# Patient Record
Sex: Female | Born: 1985 | Hispanic: No | Marital: Married | Smoking: Never smoker
Health system: Southern US, Community
[De-identification: ages and names within clinical notes are randomized; demographics above are authoritative.]

## PROBLEM LIST (undated history)

## (undated) DIAGNOSIS — N200 Calculus of kidney: Secondary | ICD-10-CM

## (undated) DIAGNOSIS — N83209 Unspecified ovarian cyst, unspecified side: Secondary | ICD-10-CM

## (undated) HISTORY — DX: Calculus of kidney: N20.0

## (undated) HISTORY — DX: Unspecified ovarian cyst, unspecified side: N83.209

## (undated) HISTORY — PX: TONSILLECTOMY: SUR1361

## (undated) HISTORY — PX: WISDOM TOOTH EXTRACTION: SHX21

---

## 2017-01-13 ENCOUNTER — Encounter (HOSPITAL_COMMUNITY): Payer: Self-pay

## 2017-01-13 ENCOUNTER — Inpatient Hospital Stay (HOSPITAL_COMMUNITY)
Admission: AD | Admit: 2017-01-13 | Discharge: 2017-01-13 | Disposition: A | Discharge status: Home / Self Care | Payer: BLUE CROSS/BLUE SHIELD | Source: Ambulatory Visit | Attending: Obstetrics & Gynecology | Admitting: Obstetrics & Gynecology

## 2017-01-13 DIAGNOSIS — N946 Dysmenorrhea, unspecified: Secondary | ICD-10-CM | POA: Diagnosis present

## 2017-01-13 LAB — URINALYSIS, ROUTINE W REFLEX MICROSCOPIC
Bilirubin Urine: NEGATIVE
GLUCOSE, UA: NEGATIVE mg/dL
HGB URINE DIPSTICK: NEGATIVE
KETONES UR: NEGATIVE mg/dL
Leukocytes, UA: NEGATIVE
Nitrite: NEGATIVE
PH: 5 (ref 5.0–8.0)
PROTEIN: NEGATIVE mg/dL
Specific Gravity, Urine: 1.014 (ref 1.005–1.030)

## 2017-01-13 LAB — POCT PREGNANCY, URINE: Preg Test, Ur: NEGATIVE

## 2017-01-13 MED ORDER — IBUPROFEN 800 MG PO TABS
400.0000 mg | ORAL_TABLET | Freq: Once | ORAL | Status: DC
  Filled 2017-01-13: qty 1

## 2017-01-13 MED ORDER — IBUPROFEN 800 MG PO TABS
800.0000 mg | ORAL_TABLET | Freq: Once | ORAL | Status: AC
  Administered 2017-01-13: 800 mg via ORAL

## 2017-01-13 NOTE — MAU Provider Note (Signed)
History   pt in c/o abd pain, cramping and period being 4 days late. Condoms as contraceptive, had on to break this cycle but used plan B. States has not taken anything for the discomfort.   CSN: [id number]  Arrival date & time 01/13/17  1811   First Provider Initiated Contact with Patient 01/13/17 1840      Chief Complaint  Patient presents with  . Abdominal Pain  . Late Period    HPI  No past medical history on file.  Past Surgical History:  Procedure Laterality Date  . CESAREAN SECTION     2014, 2017    Family History  Problem Relation Age of Onset  . Drug abuse Paternal Grandmother     Social History  Substance Use Topics  . Smoking status: Never Smoker  . Smokeless tobacco: Never Used  . Alcohol use No    OB History    No data available      Review of Systems  Constitutional: Negative.   HENT: Negative.   Eyes: Negative.   Respiratory: Negative.   Cardiovascular: Negative.   Gastrointestinal: Positive for abdominal pain.  Endocrine: Negative.   Genitourinary: Positive for vaginal bleeding.  Musculoskeletal: Negative.   Skin: Negative.   Allergic/Immunologic: Negative.   Neurological: Negative.   Hematological: Negative.   Psychiatric/Behavioral: Negative.     Allergies  Patient has no known allergies.  Home Medications    There were no vitals taken for this visit.  Physical Exam  Constitutional: She is oriented to person, place, and time. She appears well-developed and well-nourished.  HENT:  Head: Normocephalic.  Eyes: Pupils are equal, round, and reactive to light.  Neck: Normal range of motion.  Cardiovascular: Normal rate, regular rhythm, normal heart sounds and intact distal pulses.   Pulmonary/Chest: Effort normal and breath sounds normal.  Abdominal: Soft. Bowel sounds are normal.  Genitourinary: Vagina normal and uterus normal.  Musculoskeletal: Normal range of motion.  Neurological: She is alert and oriented to person, place,  and time. She has normal reflexes.  Skin: Skin is warm and dry.  Psychiatric: She has a normal mood and affect. Her behavior is normal. Judgment and thought content normal.    MAU Course  Procedures (including critical care time)  Labs Reviewed  URINALYSIS, ROUTINE W REFLEX MICROSCOPIC  POCT PREGNANCY, URINE   No results found.   No diagnosis found.  Dysmenorrhea   MDM  VSS, bleeding sm amt with exam. Explained to pt that Plan B can alter her cycle. And also it may take up to 3 mos for her period to regulate. Exam WNL will d/c home.

## 2017-01-13 NOTE — Discharge Instructions (Signed)
Dysmenorrhea Dysmenorrhea means painful cramps during your period (menstrual period). You will have pain in your lower belly (abdomen). The pain is caused by the tightening (contracting) of the muscles of the womb (uterus). The pain may be mild or very bad. With this condition, you may:  Have a headache.  Feel sick to your stomach (nauseous).  Throw up (vomit).  Have lower back pain. Follow these instructions at home: Helping pain and cramping   Put heat on your lower back or belly when you have pain or cramps. Use the heat source that your doctor tells you to use.  Place a towel between your skin and the heat.  Leave the heat on for 20-30 minutes.  Remove the heat if your skin turns bright red. This is especially important if you cannot feel pain, heat, or cold.  Do not have a heating pad on during sleep.  Do aerobic exercises. These include walking, swimming, or biking. These may help with cramps.  Massage your lower back or belly. This may help lessen pain. General instructions   Take over-the-counter and prescription medicines only as told by your doctor.  Do not drive or use heavy machinery while taking prescription pain medicine.  Avoid alcohol and caffeine during and right before your period. These can make cramps worse.  Do not use any products that have nicotine or tobacco. These include cigarettes and e-cigarettes. If you need help quitting, ask your doctor.  Keep all follow-up visits as told by your doctor. This is important. Contact a doctor if:  You have pain that gets worse.  You have pain that does not get better with medicine.  You have pain during sex.  You feel sick to your stomach or you throw up during your period, and medicine does not help. Get help right away if:  You pass out (faint). Summary  Dysmenorrhea means painful cramps during your period (menstrual period).  Put heat on your lower back or belly when you have pain or cramps.  Do  exercises like walking, swimming, or biking to help with cramps.  Contact a doctor if you have pain during sex. This information is not intended to replace advice given to you by your health care provider. Make sure you discuss any questions you have with your health care provider. Document Released: 11/24/2008 Document Revised: 09/14/2016 Document Reviewed: 09/14/2016 Elsevier Interactive Patient Education  2017 Elsevier Inc.  

## 2017-01-13 NOTE — Progress Notes (Addendum)
G3P2 late period. Presents to triage for late period and abdominal pain.   Provider at bs assessing pt. Orders received to give motrin 800mg  PO.   Drink and cracker given  1849: Motrin administered. Pt tolertating crackers and drink  1858: provider at bs.   Discharge instructions given with pt understanding. Pt left unit via ambulatory.

## 2019-04-30 ENCOUNTER — Other Ambulatory Visit: Payer: Self-pay

## 2019-04-30 DIAGNOSIS — Z20822 Contact with and (suspected) exposure to covid-19: Secondary | ICD-10-CM

## 2019-05-01 LAB — NOVEL CORONAVIRUS, NAA: SARS-CoV-2, NAA: NOT DETECTED

## 2019-06-25 ENCOUNTER — Telehealth: Payer: Self-pay

## 2019-06-25 NOTE — Telephone Encounter (Signed)
Copied from [place] [id number]. Topic: Appointment Scheduling - New Patient >> Jun 24, 2019  5:07 PM Rutherford Nail, [place] wrote: New patient has been scheduled for your office. Provider: Dr Aundra Dubin Date of Appointment: 09/04/2019 at 8:30am  Route to department's PEC pool.

## 2019-09-04 ENCOUNTER — Encounter: Payer: Self-pay | Admitting: Internal Medicine

## 2019-09-04 ENCOUNTER — Ambulatory Visit (INDEPENDENT_AMBULATORY_CARE_PROVIDER_SITE_OTHER): Payer: Managed Care, Other (non HMO) | Admitting: Internal Medicine

## 2019-09-04 ENCOUNTER — Other Ambulatory Visit: Payer: Self-pay

## 2019-09-04 VITALS — Ht 62.0 in | Wt 180.0 lb

## 2019-09-04 DIAGNOSIS — E611 Iron deficiency: Secondary | ICD-10-CM

## 2019-09-04 DIAGNOSIS — N920 Excessive and frequent menstruation with regular cycle: Secondary | ICD-10-CM | POA: Insufficient documentation

## 2019-09-04 DIAGNOSIS — M542 Cervicalgia: Secondary | ICD-10-CM

## 2019-09-04 DIAGNOSIS — M25511 Pain in right shoulder: Secondary | ICD-10-CM

## 2019-09-04 DIAGNOSIS — E538 Deficiency of other specified B group vitamins: Secondary | ICD-10-CM

## 2019-09-04 DIAGNOSIS — L80 Vitiligo: Secondary | ICD-10-CM | POA: Diagnosis not present

## 2019-09-04 DIAGNOSIS — L659 Nonscarring hair loss, unspecified: Secondary | ICD-10-CM | POA: Insufficient documentation

## 2019-09-04 DIAGNOSIS — R5383 Other fatigue: Secondary | ICD-10-CM | POA: Diagnosis not present

## 2019-09-04 DIAGNOSIS — N83209 Unspecified ovarian cyst, unspecified side: Secondary | ICD-10-CM

## 2019-09-04 DIAGNOSIS — Z1389 Encounter for screening for other disorder: Secondary | ICD-10-CM

## 2019-09-04 DIAGNOSIS — Z13220 Encounter for screening for lipoid disorders: Secondary | ICD-10-CM

## 2019-09-04 DIAGNOSIS — E559 Vitamin D deficiency, unspecified: Secondary | ICD-10-CM

## 2019-09-04 DIAGNOSIS — L305 Pityriasis alba: Secondary | ICD-10-CM

## 2019-09-04 DIAGNOSIS — E0789 Other specified disorders of thyroid: Secondary | ICD-10-CM

## 2019-09-04 MED ORDER — DESONIDE 0.05 % EX CREA: TOPICAL_CREAM | Freq: Two times a day (BID) | CUTANEOUS | 0 refills | Status: AC

## 2019-09-04 NOTE — Progress Notes (Signed)
Virtual Visit via Video Note  I connected with Kristin Freeman  on 09/04/19 at  8:37 AM EST by a video enabled telemedicine application and verified that I am speaking with the correct person using two identifiers.  Location patient: home Location provider:work or home office Persons participating in the virtual visit: patient, provider  I discussed the limitations of evaluation and management by telemedicine and the availability of in person appointments. The patient expressed understanding and agreed to proceed.   HPI:  New patient with multiple complaints  1.fatigue new  2. FH thyroid d/o and wants thyroid checked  3. C/o white patches near right eye and right side of mouth getting more prominent over months and noticeable to her mom  4. C/o hair loss dad with female pattern baldness otherwise no hair loss in family and grey hairs since age 54 y.o  5. C/o heavy cycles lasting 2-3 days which is different pattern and h/o ovarian cyst and also c/o weight gain 30-40 lbs in 6-7 months  6. C/o weight gain no change in diet not really exercising  7. Right neck in area of thyroid ttp and bulging but goes away at times for weeks and right shoulder pain and ttp x 1-2 weeks now slightly improved    ROS: See pertinent positives and negatives per HPI. General: wt gain 30-40 lbs x 6-7 months  HEENT: normal hearing +neck swelling  CV: no chest pain  Lungs: no sob  GI: no ab pain  GU: +change in cycle  MSK: right shoulder pain  Skin: hair loss and white patches on skin  Neuro: no h/a  Psych: mood normal   Past Medical History:  Diagnosis Date  . Kidney stones   . Ovarian cyst     Past Surgical History:  Procedure Laterality Date  . CESAREAN SECTION     2014, 2017  . TONSILLECTOMY    . WISDOM TOOTH EXTRACTION      Family History  Problem Relation Age of Onset  . Diabetes Paternal Grandmother   . Breast cancer Mother        dx age 68 stage 92  . Anxiety disorder Mother   . Diabetes  Maternal Grandmother   . Diabetes Maternal Grandfather   . Diabetes Paternal Grandfather   . Thyroid disease Other        cousin   . Thyroid disease Maternal Aunt     SOCIAL HX:  2 kids married  Works from home  [place] husband Ramour Azhar [place] from [place] 1 year ago in 2019    Current Outpatient Medications:  .  desonide (DESOWEN) 0.05 % cream, Apply topically 2 (two) times daily. Prn face, Disp: 30 g, Rfl: 0  EXAM:  VITALS per patient if applicable:  GENERAL: alert, oriented, appears well and in no acute distress  HEENT: atraumatic, conjunttiva clear, no obvious abnormalities on inspection of external nose and ears  NECK: normal movements of the head and neck  LUNGS: on inspection no signs of respiratory distress, breathing rate appears normal, no obvious gross SOB, gasping or wheezing  CV: no obvious cyanosis  MS: moves all visible extremities without noticeable abnormality  PSYCH/NEURO: pleasant and cooperative, no obvious depression or anxiety, speech and thought processing grossly intact  ASSESSMENT AND PLAN:  Discussed the following assessment and plan:  Fatigue, unspecified type - Plan: Comprehensive metabolic panel, CBC with Differential/Platelet, TSH, T4, free, T3, free  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 Thyroid vs MSK Consider thyroid US in future   Hair loss - Plan: Ambulatory referral to Dermatology  Menorrhagia with regular cycle - Plan: obgyn Cyst of ovary, unspecified laterality - Plan: obgyn  HM ? If had flu shot in 2019/2020 Tdap had in 2012 HPV vaccine not had   Pap had in [place] in 2019  Refer dermatology c/w vitiligo/eczema p. Alba to right side of face with white spots and hair loss  mammo age 33 y.o   -we discussed possible serious and likely etiologies,  options for evaluation and workup, limitations of telemedicine visit vs in person visit, treatment, treatment risks and precautions. Pt prefers to treat via telemedicine empirically rather then risking or undertaking an in person visit at this moment. Patient agrees to seek prompt in person care if worsening, new symptoms arise, or if is not improving with treatment.   I discussed the assessment and treatment plan with the patient. The patient was provided an opportunity to ask questions and all were answered. The patient agreed with the plan and demonstrated an understanding of the instructions.   The patient was advised to call back or seek an in-person evaluation if the symptoms worsen or if the condition fails to improve as anticipated.  Time spent 30 minutes Bevelyn Buckles, MD

## 2019-09-04 NOTE — Patient Instructions (Signed)

## 2021-09-22 ENCOUNTER — Ambulatory Visit: Payer: Managed Care, Other (non HMO) | Admitting: Dermatology

## 2021-10-11 ENCOUNTER — Ambulatory Visit: Payer: Managed Care, Other (non HMO) | Admitting: Dermatology

## 2021-10-11 ENCOUNTER — Other Ambulatory Visit: Payer: Self-pay

## 2021-10-11 DIAGNOSIS — L700 Acne vulgaris: Secondary | ICD-10-CM

## 2021-10-11 DIAGNOSIS — L988 Other specified disorders of the skin and subcutaneous tissue: Secondary | ICD-10-CM

## 2021-10-11 DIAGNOSIS — B001 Herpesviral vesicular dermatitis: Secondary | ICD-10-CM

## 2021-10-11 MED ORDER — TRETINOIN 0.025 % EX CREA: TOPICAL_CREAM | Freq: Every day | CUTANEOUS | 11 refills | Status: AC

## 2021-10-11 MED ORDER — VALACYCLOVIR HCL 500 MG PO TABS: 500.0000 mg | ORAL_TABLET | Freq: Every day | ORAL | 11 refills | Status: DC

## 2021-10-11 MED ORDER — AMZEEQ 4 % EX FOAM: 1.0000 "application " | Freq: Every day | CUTANEOUS | 6 refills | Status: AC

## 2021-10-11 NOTE — Progress Notes (Signed)
° °  New Patient Visit  Subjective  Kristin Freeman is a 36 y.o. female who presents for the following: Facial Elastosis (Pt would to discuss options for tightening at that jaw line and wrinkles at the forehead) and Acne (Pt states that she occasionally gets minor hormonal acne and also would like to discuss acne scars from previous years of cystic acne. ).  Objective  Well appearing patient in no apparent distress; mood and affect are within normal limits.  A focused examination was performed including face. Relevant physical exam findings are noted in the Assessment and Plan.  face Rhytides and volume loss.   Head - Anterior (Face) Trace open comedones with rare inflammatory papule at face. Many older scars.  Lips Erythematous papule with vesicles.   Assessment & Plan  Elastosis of skin face  Would do 2 syringes of voluma at jawline if we were to use filler for the lift.  Would consider a consult with Dr. Sedalia Muta at [facility] in [place] to consider other options.     Acne vulgaris Head - Anterior (Face)  Start Tretinoin 0.025% cream. Apply pea sized amount to face starting every other night, increase to qd as tolerated. Follow with a moisturizer.   Start amzeeq foam. Apply thin layer to affected areas as needed for spot treatment. Sent to [place].   Has done Fraxel laser in past for acne scarring with improvement. Recommend Fraxel or Halo laser or microneedling.  Recommend [facility] in [facility] or [facility] in [place].   tretinoin (RETIN-A) 0.025 % cream - Head - Anterior (Face) Apply topically at bedtime.  Minocycline HCl Micronized (AMZEEQ) 4 % FOAM - Head - Anterior (Face) Apply 1 application topically daily. PRN spot treatment.  Cold sore Lips  Start Valacyclovir 500 mg qd for prevention. Take with large glass of water.   Take 4 tabs (2000 mg total) and repeat in 12 hours for flares.   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  It is infectious and can be spread from person to person by direct contact.  It is not curable, but is treatable with topical and oral medication.  Chronic and persistent condition with duration or expected duration over one year. Condition is bothersome/symptomatic for patient. Currently flared.   valACYclovir (VALTREX) 500 MG tablet - Lips Take 1 tablet (500 mg total) by mouth daily.   Return in about 1 year (around 10/11/2022) for acne.  I, Epifania Gore, CMA, am acting as scribe for Darden Dates, MD.  Documentation: I have reviewed the above documentation for accuracy and completeness, and I agree with the above.  Darden Dates, MD

## 2021-10-11 NOTE — Patient Instructions (Addendum)
Recommend Fraxel or Halo laser or microneedling.  Recommend Dr. Sedalia Muta [facility] in [place] or [facility] in [place].   For Cold Sore Flare: Take 4 tabs (2000 mg total) and repeat in 12 hours.    If You Need Anything After Your Visit  If you have any questions or concerns for your doctor, please call our main line at [phone] and press option 4 to reach your doctor's medical assistant. If no one answers, please leave a voicemail as directed and we will return your call as soon as possible. Messages left after 4 pm will be answered the following business day.   You may also send [place] a message via MyChart. We typically respond to MyChart messages within 1-2 business days.  For prescription refills, please ask your pharmacy to contact our office. Our fax number is [phone].  If you have an urgent issue when the clinic is closed that cannot wait until the next business day, you can page your doctor at the number below.    Please note that while we do our best to be available for urgent issues outside of office hours, we are not available 24/7.   If you have an urgent issue and are unable to reach [place], you may choose to seek medical care at your doctor's office, retail clinic, urgent care center, or emergency room.  If you have a medical emergency, please immediately call 911 or go to the emergency department.  Pager Numbers  - Dr. Gwen Pounds: [phone]  - Dr. Neale Burly: [phone]  - Dr. Roseanne Reno: [phone]  In the event of inclement weather, please call our main line at [phone] for an update on the status of any delays or closures.  Dermatology Medication Tips: Please keep the boxes that topical medications come in in order to help keep track of the instructions about where and how to use these. Pharmacies typically print the medication instructions only on the boxes and not directly on the medication tubes.   If your medication is too  expensive, please contact our office at [phone] option 4 or send [place] a message through MyChart.   We are unable to tell what your co-pay for medications will be in advance as this is different depending on your insurance coverage. However, we may be able to find a substitute medication at lower cost or fill out paperwork to get insurance to cover a needed medication.   If a prior authorization is required to get your medication covered by your insurance company, please allow [place] 1-2 business days to complete this process.  Drug prices often vary depending on where the prescription is filled and some pharmacies may offer cheaper prices.  The website www.goodrx.com contains coupons for medications through different pharmacies. The prices here do not account for what the cost may be with help from insurance (it may be cheaper with your insurance), but the website can give you the price if you did not use any insurance.  - You can print the associated coupon and take it with your prescription to the pharmacy.  - You may also stop by our office during regular business hours and pick up a GoodRx coupon card.  - If you need your prescription sent electronically to a different pharmacy, notify our office through [facility] or by phone at [phone] option 4.     Si Usted Necesita Algo Despus de Su Visita  Tambin puede enviarnos un mensaje a travs de Clinical cytogeneticist. Por lo  general respondemos a los mensajes de MyChart en el transcurso de 1 a 2 das hbiles.  Para renovar recetas, por favor pida a su farmacia que se ponga en contacto con nuestra oficina. [place] de fax es [place] [phone].  Si tiene un asunto urgente cuando la clnica est cerrada y que no puede esperar hasta el siguiente da hbil, puede llamar/localizar a su doctor(a) al nmero que aparece a continuacin.   Por favor, tenga en cuenta que aunque hacemos todo lo posible para estar disponibles para asuntos urgentes fuera  del horario de [place], no estamos disponibles las 24 horas del da, los 7 das de la [place].   Si tiene un problema urgente y no puede comunicarse con nosotros, puede optar por buscar atencin mdica  en el consultorio de su doctor(a), en una clnica privada, en un centro de atencin urgente o en una sala de emergencias.  Si tiene Engineering geologist, por favor llame inmediatamente al 911 o vaya a la sala de emergencias.  Nmeros de bper  - Dr. Nehemiah Massed: [phone]  - Dra. Moye: [phone]  - Dra. Nicole Kindred: [phone]  En caso de inclemencias del [place], por favor llame a Johnsie Kindred principal al [phone] para una actualizacin sobre el [place] de cualquier retraso o cierre.  Consejos para la medicacin en dermatologa: Por favor, guarde las cajas en las que vienen los medicamentos de uso tpico para ayudarle a seguir las instrucciones sobre dnde y cmo usarlos. Las farmacias generalmente imprimen las instrucciones del medicamento slo en las cajas y no directamente en los tubos del [place].   Si su medicamento es muy caro, por favor, pngase en contacto con Zigmund Daniel llamando al [phone] y presione la opcin 4 o envenos un mensaje a travs de Pharmacist, community.   No podemos decirle cul ser su copago por los medicamentos por adelantado ya que esto es diferente dependiendo de la cobertura de su seguro. Sin embargo, es posible que podamos encontrar un medicamento sustituto a Electrical engineer un formulario para que el seguro cubra el medicamento que se considera necesario.   Si se requiere una autorizacin previa para que su compaa de seguros [place] su medicamento, por favor permtanos de 1 a 2 das hbiles para completar este proceso.  Los precios de los medicamentos varan con frecuencia dependiendo del Environmental consultant de dnde se surte la receta y alguna farmacias pueden ofrecer precios ms baratos.  El sitio web www.goodrx.com tiene cupones para medicamentos de Office manager. Los precios aqu no tienen en cuenta lo que podra costar con la ayuda del seguro (puede ser ms barato con su seguro), pero el sitio web puede darle el precio si no utiliz Research scientist (physical sciences).  - Puede imprimir el cupn correspondiente y llevarlo con su receta a la farmacia.  - Tambin puede pasar por nuestra oficina durante el horario de atencin regular y Charity fundraiser una tarjeta de cupones de GoodRx.  - Si necesita que su receta se enve electrnicamente a una farmacia diferente, informe a nuestra oficina a travs de MyChart de [place] o por telfono llamando al [phone] y presione la opcin 4.

## 2021-10-19 ENCOUNTER — Encounter: Payer: Self-pay | Admitting: Dermatology

## 2022-02-16 ENCOUNTER — Emergency Department
Admission: EM | Admit: 2022-02-16 | Discharge: 2022-02-17 | Disposition: A | Discharge status: Home / Self Care | Payer: Managed Care, Other (non HMO) | Attending: Emergency Medicine | Admitting: Emergency Medicine

## 2022-02-16 ENCOUNTER — Other Ambulatory Visit: Payer: Self-pay

## 2022-02-16 ENCOUNTER — Emergency Department: Payer: Managed Care, Other (non HMO)

## 2022-02-16 DIAGNOSIS — M6281 Muscle weakness (generalized): Secondary | ICD-10-CM | POA: Insufficient documentation

## 2022-02-16 DIAGNOSIS — R4789 Other speech disturbances: Secondary | ICD-10-CM | POA: Diagnosis not present

## 2022-02-16 DIAGNOSIS — R519 Headache, unspecified: Secondary | ICD-10-CM

## 2022-02-16 DIAGNOSIS — R202 Paresthesia of skin: Secondary | ICD-10-CM | POA: Diagnosis not present

## 2022-02-16 DIAGNOSIS — H9312 Tinnitus, left ear: Secondary | ICD-10-CM | POA: Insufficient documentation

## 2022-02-16 DIAGNOSIS — R479 Unspecified speech disturbances: Secondary | ICD-10-CM

## 2022-02-16 DIAGNOSIS — F419 Anxiety disorder, unspecified: Secondary | ICD-10-CM | POA: Insufficient documentation

## 2022-02-16 LAB — CBC
HCT: 43.2 % (ref 36.0–46.0)
Hemoglobin: 14.2 g/dL (ref 12.0–15.0)
MCH: 30.1 pg (ref 26.0–34.0)
MCHC: 32.9 g/dL (ref 30.0–36.0)
MCV: 91.7 fL (ref 80.0–100.0)
Platelets: 308 10*3/uL (ref 150–400)
RBC: 4.71 MIL/uL (ref 3.87–5.11)
RDW: 13.5 % (ref 11.5–15.5)
WBC: 8.9 10*3/uL (ref 4.0–10.5)
nRBC: 0 % (ref 0.0–0.2)

## 2022-02-16 LAB — COMPREHENSIVE METABOLIC PANEL
ALT: 15 U/L (ref 0–44)
AST: 19 U/L (ref 15–41)
Albumin: 3.9 g/dL (ref 3.5–5.0)
Alkaline Phosphatase: 49 U/L (ref 38–126)
Anion gap: 4 — ABNORMAL LOW (ref 5–15)
BUN: 15 mg/dL (ref 6–20)
CO2: 24 mmol/L (ref 22–32)
Calcium: 8.9 mg/dL (ref 8.9–10.3)
Chloride: 109 mmol/L (ref 98–111)
Creatinine, Ser: 0.82 mg/dL (ref 0.44–1.00)
GFR, Estimated: >60 mL/min (ref >60)
Glucose, Bld: 103 mg/dL — ABNORMAL HIGH (ref 70–99)
Potassium: 3.7 mmol/L (ref 3.5–5.1)
Sodium: 137 mmol/L (ref 135–145)
Total Bilirubin: 0.7 mg/dL (ref 0.3–1.2)
Total Protein: 7.2 g/dL (ref 6.5–8.1)

## 2022-02-16 LAB — PROTIME-INR
INR: 1 (ref 0.8–1.2)
Prothrombin Time: 13.5 seconds (ref 11.4–15.2)

## 2022-02-16 LAB — DIFFERENTIAL
Abs Immature Granulocytes: 0.02 10*3/uL (ref 0.00–0.07)
Basophils Absolute: 0.1 10*3/uL (ref 0.0–0.1)
Basophils Relative: 1 %
Eosinophils Absolute: 0.1 10*3/uL (ref 0.0–0.5)
Eosinophils Relative: 1 %
Immature Granulocytes: 0 %
Lymphocytes Relative: 30 %
Lymphs Abs: 2.6 10*3/uL (ref 0.7–4.0)
Monocytes Absolute: 0.4 10*3/uL (ref 0.1–1.0)
Monocytes Relative: 5 %
Neutro Abs: 5.7 10*3/uL (ref 1.7–7.7)
Neutrophils Relative %: 63 %

## 2022-02-16 LAB — APTT: aPTT: 30 seconds (ref 24–36)

## 2022-02-16 LAB — HCG, QUANTITATIVE, PREGNANCY: hCG, Beta Chain, Quant, S: <1 m[IU]/mL (ref <5)

## 2022-02-16 IMAGING — CT CT HEAD W/O CM
4 series · 16 of 47 positions shown, 18 images · non-contrast
Comparison: None Available.

CLINICAL DATA: Slurred speech and left-sided arm numbness.



[Series 2: head bone · axial · 0.39mm/px · z∈[+135,+163]mm · 3 of 70 slices shown]
[im 7/70  bone]
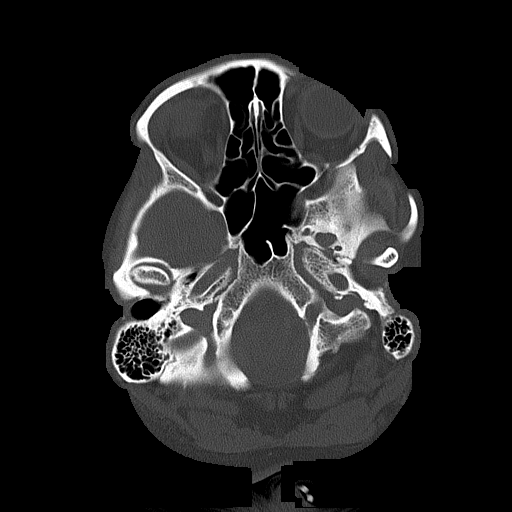
[im 14/70  bone]
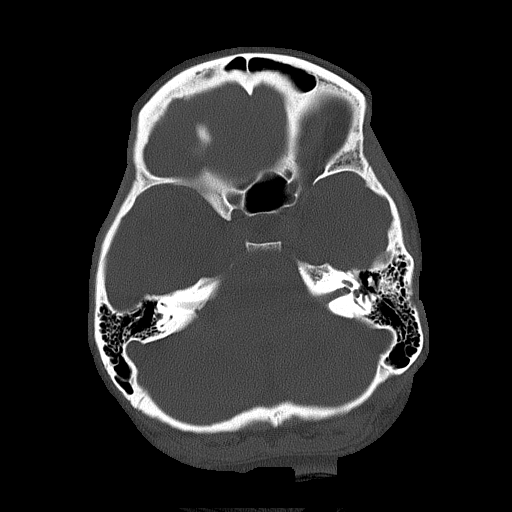
[im 21/70  bone]
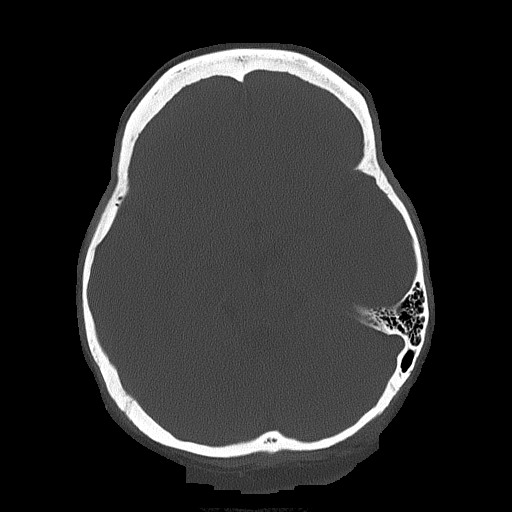

[Series 3: head wo · axial · 0.39mm/px · z∈[+138,+238]mm · 7 of 28 slices shown, 9 images]
[im 4/28  brain]
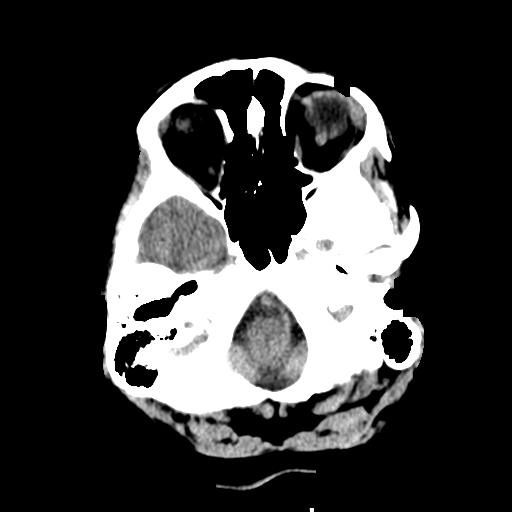
[im 4/28  bone]
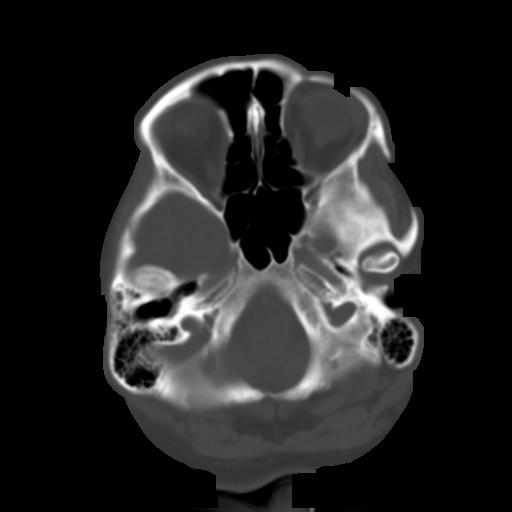
[im 7/28  brain]
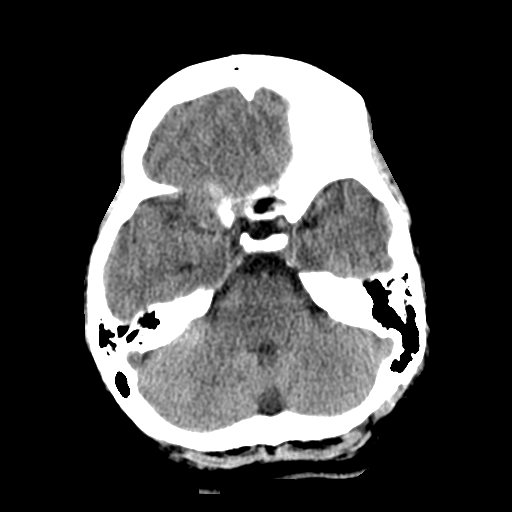
[im 11/28  brain]
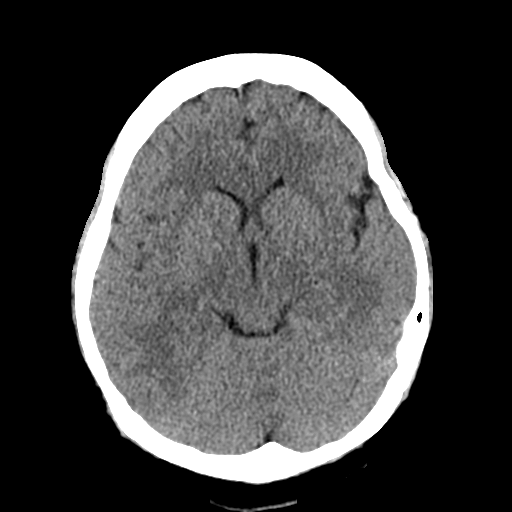
[im 14/28  brain]
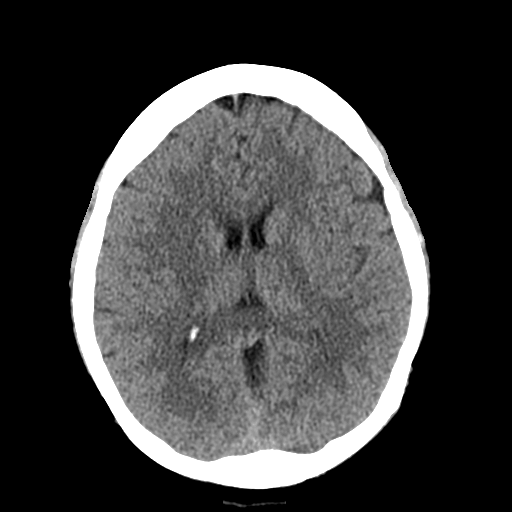
[im 17/28  brain]
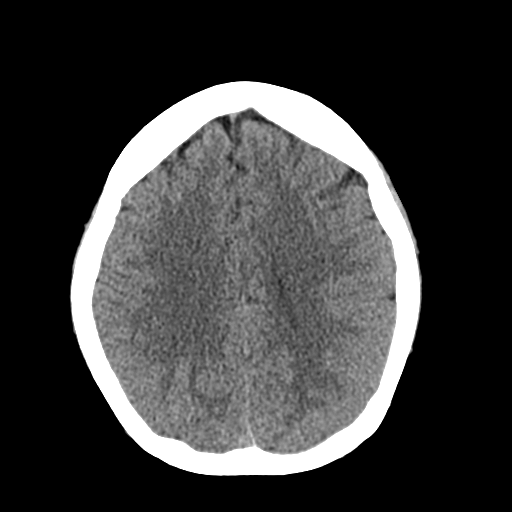
[im 17/28  bone]
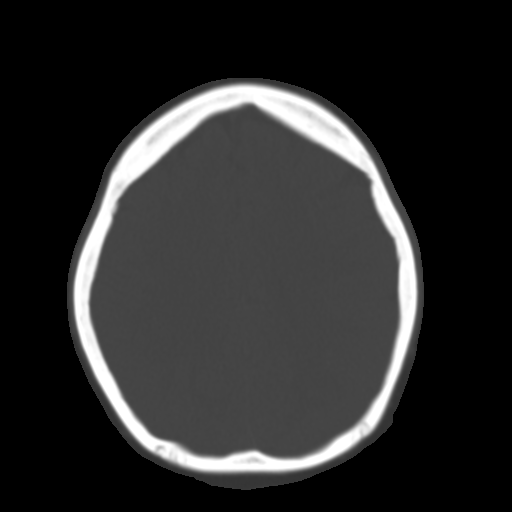
[im 21/28  brain]
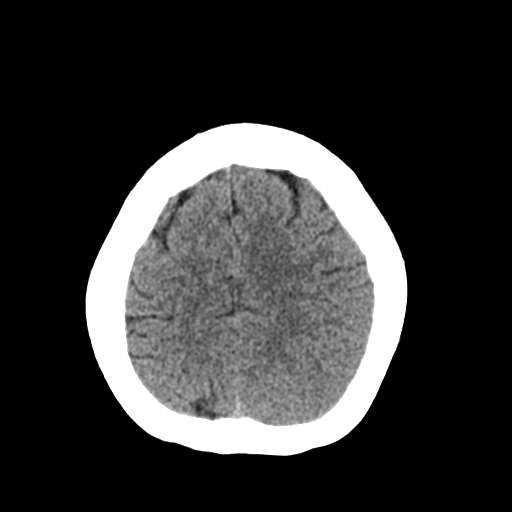
[im 24/28  brain]
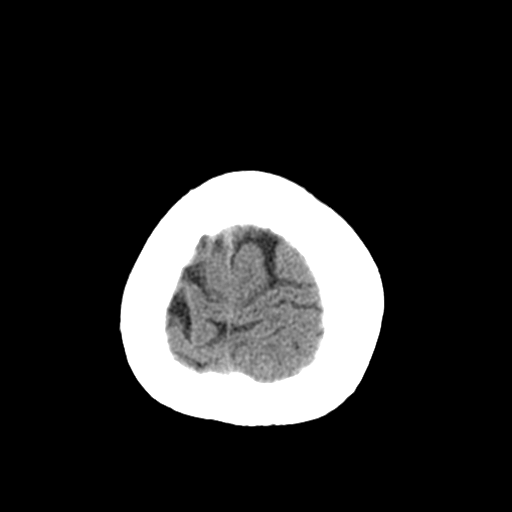

[Series 4: coronal soft tissue · coronal · 0.29mm/px · 3 of 61 slices shown]
[im 21/61  brain]
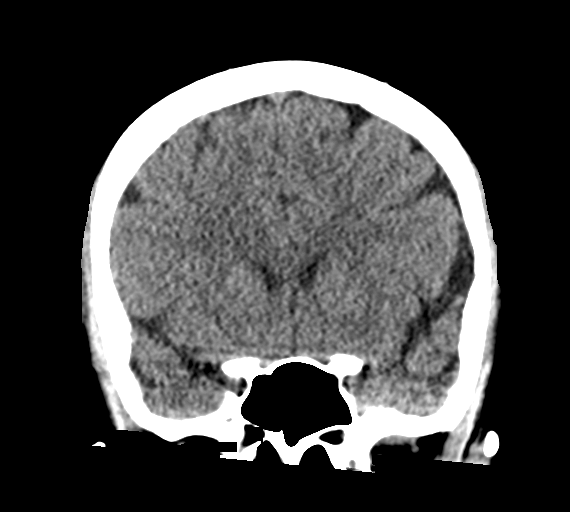
[im 27/61  brain]
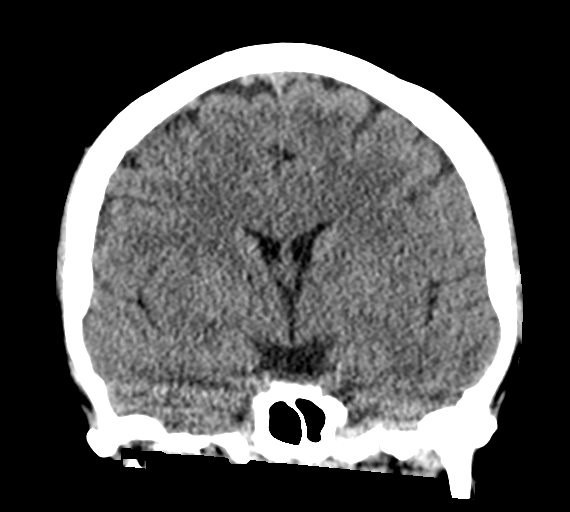
[im 34/61  brain]
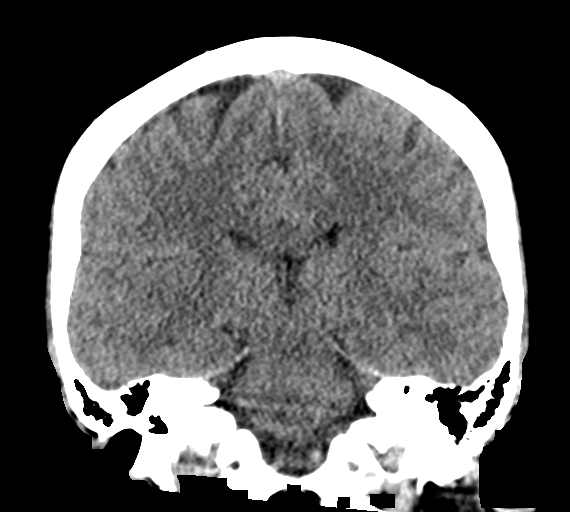

[Series 5: sagittal soft tissue · sagittal · 0.29mm/px · 3 of 56 slices shown]
[im 19/56  brain]
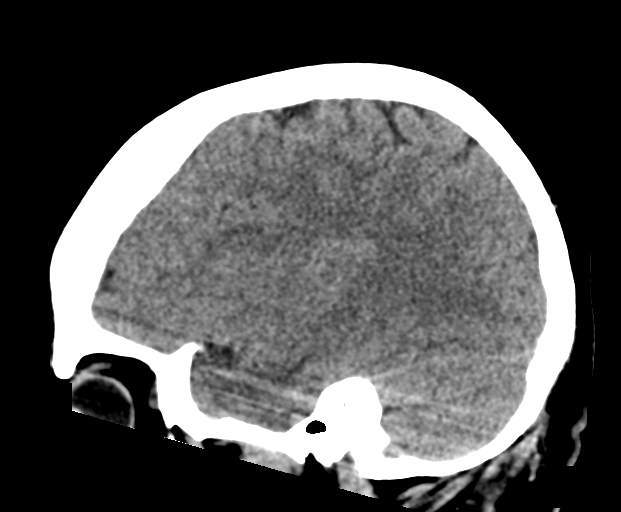
[im 28/56  brain]
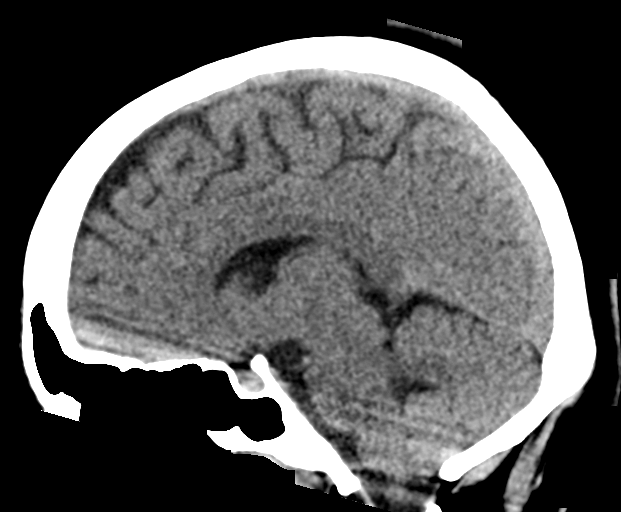
[im 37/56  brain]
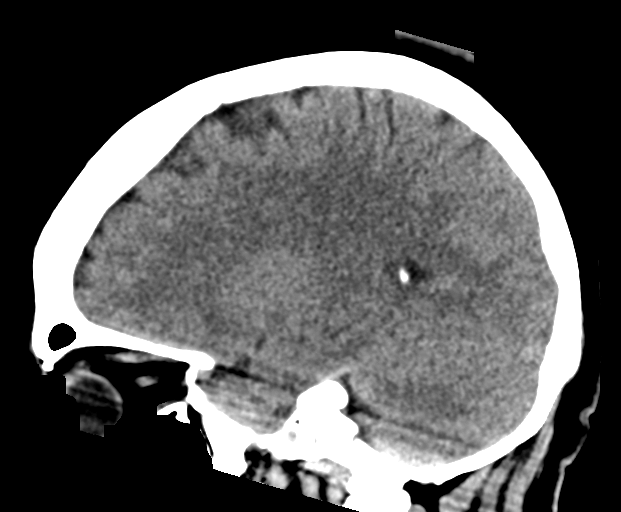

[16 of 47 positions shown; findings below may reference images not displayed]

FINDINGS: Brain: No evidence of acute infarction, hemorrhage, hydrocephalus,
extra-axial collection or mass lesion/mass effect.

Vascular: No hyperdense vessel or unexpected calcification.

Skull: Normal. Negative for fracture or focal lesion.

Sinuses/Orbits: No acute finding.

Other: None.
IMPRESSION: No acute intracranial pathology.

## 2022-02-16 MED ORDER — LACTATED RINGERS IV BOLUS
1000.0000 mL | Freq: Once | INTRAVENOUS | Status: AC
  Administered 2022-02-17: 1000 mL via INTRAVENOUS

## 2022-02-16 MED ORDER — SODIUM CHLORIDE 0.9% FLUSH: 3.0000 mL | Freq: Once | INTRAVENOUS | Status: DC

## 2022-02-16 MED ORDER — LORAZEPAM 2 MG/ML IJ SOLN
1.0000 mg | Freq: Once | INTRAMUSCULAR | Status: AC
  Administered 2022-02-17: 1 mg via INTRAVENOUS
  Filled 2022-02-16: qty 1

## 2022-02-16 MED ORDER — MAGNESIUM SULFATE 2 GM/50ML IV SOLN
2.0000 g | Freq: Once | INTRAVENOUS | Status: AC
  Administered 2022-02-17: 2 g via INTRAVENOUS
  Filled 2022-02-16: qty 50

## 2022-02-16 NOTE — ED Provider Notes (Signed)
[facility] Provider Note    Event Date/Time   First MD Initiated Contact with Patient 02/16/22 2313     (approximate)   History   Aphasia   HPI  Kristin Freeman is a 36 y.o. female with a past medical history of a kidney stone and ovarian cyst who presents for evaluation of left-sided headache, left-sided tinnitus as well as left arm weakness and numbness that she noticed around 230 today.  She also states around this time she developed difficulty remembering how to say certain things and had difficulty speaking.  She states she feels back to baseline now and think she started feeling better around 3:30 PM.  She states she took an Excedrin but does not usually have headaches like this or migraine history.  She has no right-sided symptoms or any other weakness or numbness and she feels back to baseline at this point.  No history of any recent injuries or falls, fevers, cough, congestion, chest pain, shortness of breath, back pain, nausea, vomiting, diarrhea, urinary symptoms or rashes.  No recent EtOH use or drug use or tobacco abuse.  No prior similar episodes.  She does note she is under a fair amount of stress having recently undergone a divorce and is not currently being treated or seen anyone for this.    Past Medical History:  Diagnosis Date   Kidney stones    Ovarian cyst      Physical Exam  Triage Vital Signs: ED Triage Vitals  Enc Vitals Group     BP 02/16/22 1852 122/88     Pulse Rate 02/16/22 1852 94     Resp 02/16/22 1852 17     Temp 02/16/22 1852 98.3 F (36.8 C)     Temp src --      SpO2 02/16/22 1852 98 %     Weight --      Height 02/16/22 1853 5\' 2"  (1.575 m)     Head Circumference --      Peak Flow --      Pain Score 02/16/22 1852 7     Pain Loc --      Pain Edu? --      Excl. in GC? --     Most recent vital signs: Vitals:   02/16/22 1852 02/17/22 0010  BP: 122/88 108/80  Pulse: 94 82  Resp: 17 18  Temp: 98.3 F (36.8 C)    SpO2: 98% 100%    General: Awake, no distress.  CV:  Good peripheral perfusion.  Resp:  Normal effort.  Abd:  No distention.  Other:  Cranial nerves II through XII grossly intact.  No pronator drift.  No finger dysmetria.  Symmetric 5/5 strength of all extremities.  Sensation intact to light touch in all extremities.    TMs are unremarkable bilaterally.  Patient has full range of motion of her neck.   ED Results / Procedures / Treatments  Labs (all labs ordered are listed, but only abnormal results are displayed) Labs Reviewed  COMPREHENSIVE METABOLIC PANEL - Abnormal; Notable for the following components:      Result Value   Glucose, Bld 103 (*)    Anion gap 4 (*)    All other components within normal limits  PROTIME-INR  APTT  CBC  DIFFERENTIAL  HCG, QUANTITATIVE, PREGNANCY  TSH  HEMOGLOBIN A1C  CBG MONITORING, ED  POC URINE PREG, ED     EKG  ECG is remarkable for sinus tachycardia with a ventricular rate  of 108, right axis deviation, unremarkable intervals without evidence of acute ischemia or significant arrhythmia.   RADIOLOGY CT w/o head on my interpretation without evidence of clear acute ischemia, hemorrhage, edema, mass effect or other acute process.  I did also review radiology's interpretation and agree with the findings.  MRI brain as well as MRA head and neck on my review without evidence of ischemia, edema, mass effect, abscess or large vessel occlusion or stenosis.  Also reviewed radiology's interpretation and agree with their findings of no acute process.   PROCEDURES:  Critical Care performed: No  .1-3 Lead EKG Interpretation  Performed by: Gilles Chiquito, MD Authorized by: Gilles Chiquito, MD     Interpretation: normal     ECG rate assessment: normal     Rhythm: sinus rhythm     Ectopy: none     Conduction: normal     The patient is on the cardiac monitor to evaluate for evidence of arrhythmia and/or significant heart rate  changes.   MEDICATIONS ORDERED IN ED: Medications  sodium chloride flush (NS) 0.9 % injection 3 mL (3 mLs Intravenous Not Given 02/17/22 0007)  lactated ringers bolus 1,000 mL (0 mLs Intravenous Stopped 02/17/22 0141)  magnesium sulfate IVPB 2 g 50 mL (0 g Intravenous Stopped 02/17/22 0141)  LORazepam (ATIVAN) injection 1 mg (1 mg Intravenous Given 02/17/22 0121)  gadobutrol (GADAVIST) 1 MMOL/ML injection 10 mL (10 mLs Intravenous Contrast Given 02/17/22 0300)     IMPRESSION / MDM / ASSESSMENT AND PLAN / ED COURSE  I reviewed the triage vital signs and the nursing notes. Patient's presentation is most consistent with acute presentation with potential threat to life or bodily function.                               Differential diagnosis includes, but is not limited to TIA, focal seizure, atypical migraine, metabolic derangements, arrhythmia versus other primary headache.  I do not see any posterior tympanic membrane bulging to suggest otitis media and there is no evidence of other clear acute infectious process around the ear where she states she has some pressure.  ECG is remarkable for sinus tachycardia with a ventricular rate of 108, right axis deviation, unremarkable intervals without evidence of acute ischemia or significant arrhythmia.  INR is WNL.  PTT WNL BC without leukocytosis or acute anemia.  hCG is negative.  CMP without any significant electrolyte or metabolic derangements.  TSH WNL.  A1c sent.    CT w/o head on my interpretation without evidence of clear acute ischemia, hemorrhage, edema, mass effect or other acute process.  I did also review radiology's interpretation and agree with the findings.  MRI brain as well as MRA head and neck on my review without evidence of ischemia, edema, mass effect, abscess or large vessel occlusion or stenosis.  Also reviewed radiology's interpretation and agree with their findings of no acute process.   After some IV fluids and exam and magnesium  for patient's headache and some mild anxiousness she states she is feeling much better after MRI.  Overall given tenderness pressure in her ear as well as headache I think TIA is much less likely than atypical migraine.  Discussed this with patient and that I think it is reasonable for her still follow-up with neurology as she has never had an episode like this before.  Discussed returning for any new or worsening of symptoms following up with  her PCP to have her A1c checked and to get connected to a therapist to help her deal with the stress of her divorce.  She is agreeable to plan.  Discharged in stable condition.  Strict return precautions advised and discussed.   FINAL CLINICAL IMPRESSION(S) / ED DIAGNOSES   Final diagnoses:  Nonintractable headache, unspecified chronicity pattern, unspecified headache type  Speech problem  Paresthesia  Tinnitus of left ear     Rx / DC Orders   ED Discharge Orders     None        Note:  This document was prepared using Dragon voice recognition software and may include unintentional dictation errors.   Gilles Chiquito, MD 02/17/22 [phone]

## 2022-02-16 NOTE — ED Notes (Signed)
ED Provider at bedside. 

## 2022-02-16 NOTE — ED Provider Triage Note (Signed)
Emergency Medicine Provider Triage Evaluation Note  Shakina Hillier, a 36 y.o. female  was evaluated in triage.  Pt complains of aphasia, facial weakness, and LUE weakness. She noted onset this afternoon while waiting in the drive-thru of [employer]. She recalls being unable to recall her childrens' names. She was told by her coworkers while on a Zoom call that she could not find words and were concerned for a stroke. She presents now with improved symptoms and AROM to the LUE. She appears anxious and tearful.  Review of Systems  Positive: aphasia Negative: Vision loss  Physical Exam  BP 122/88   Pulse 94   Temp 98.3 F (36.8 C)   Resp 17   Ht 5\' 2"  (1.575 m)   SpO2 98%   BMI 32.92 kg/m  Gen:   Awake, no distress  NAD Resp:  Normal effort CTA MSK:   Moves extremities without difficulty decreased left hand grip CVs::  RRR  Medical Decision Making  Medically screening exam initiated at 6:55 PM.  Appropriate orders placed.  Anzal Delre was informed that the remainder of the evaluation will be completed by another provider, this initial triage assessment does not replace that evaluation, and the importance of remaining in the ED until their evaluation is complete.  Patient to the ED several hours after onset of aphasia, left facial numbness, and left upper extremity weakness.   Melvenia Needles, PA-C 02/16/22 1908

## 2022-02-16 NOTE — ED Triage Notes (Signed)
Patient to ER via POV, reports that at 1400 this afternoon she started experiencing slurred speech, left sided arm numbness, left sided face numbness, and a headache. States that for the last 2-3 days she has had a pressure in her left ear, when she started experiencing the numbness her ear also started ringing. Reports around 1630 her symptoms started improving but she is not back at baseline. Reports she has also been forgetful this afternoon, including forgetting her children's names.   Speech delayed in triage, stutter present.

## 2022-02-17 ENCOUNTER — Emergency Department: Payer: Managed Care, Other (non HMO)

## 2022-02-17 LAB — HEMOGLOBIN A1C
Hgb A1c MFr Bld: 4.8 % (ref 4.8–5.6)
Mean Plasma Glucose: 91.06 mg/dL

## 2022-02-17 LAB — TSH: TSH: 2.869 u[IU]/mL (ref 0.350–4.500)

## 2022-02-17 IMAGING — MR MR MRA NECK WO/W CM
1 of 2 series · 20 of 48 positions shown · IV contrast (6ml Gadavist)
Comparison: None Available.

CLINICAL DATA: Facial weakness and left upper extremity weakness



[Series 15: angio_fl3d_cor_post_ttc=2.0s · coronal · 0.9mm · 0.85mm/px · 20 of 96 slices shown]
[im 1/96]
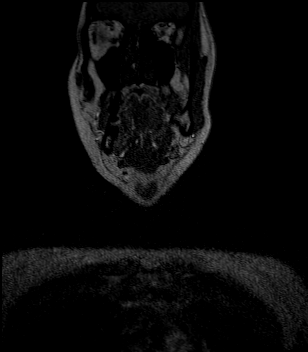
[im 6/96]
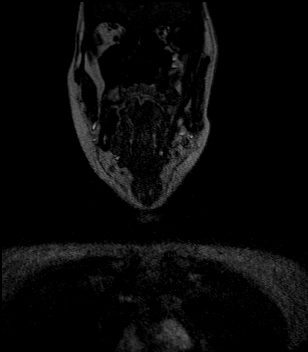
[im 11/96]
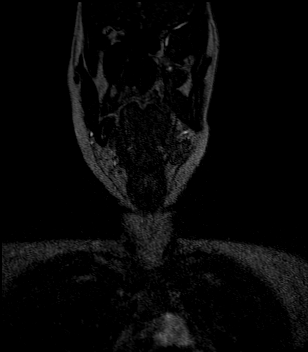
[im 16/96]
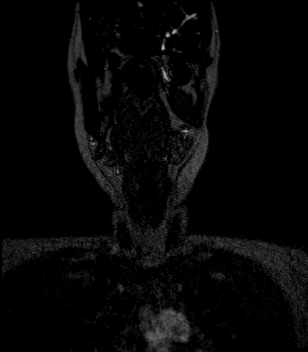
[im 21/96]
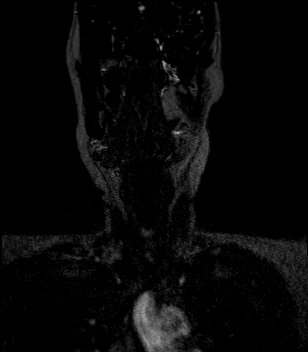
[im 26/96]
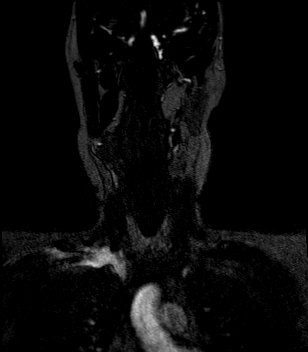
[im 31/96]
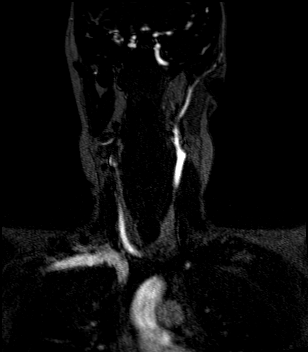
[im 36/96]
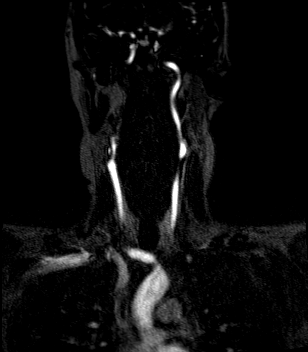
[im 41/96]
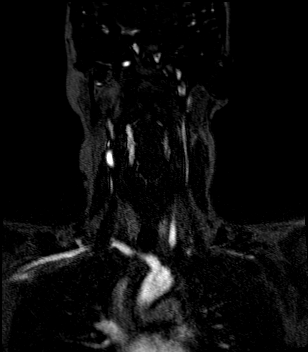
[im 46/96]
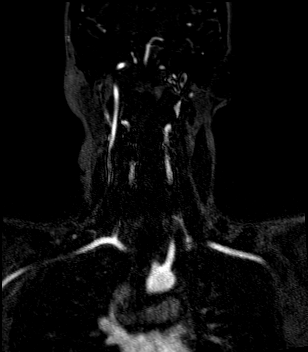
[im 51/96]
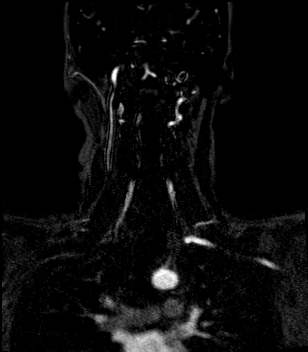
[im 56/96]
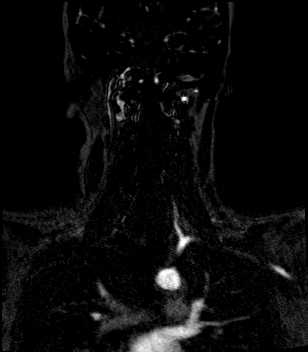
[im 61/96]
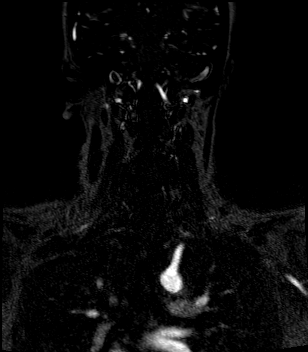
[im 66/96]
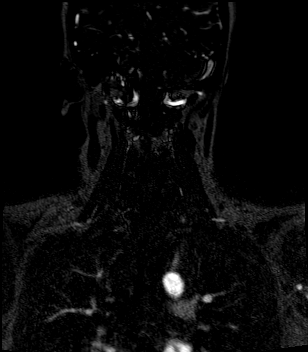
[im 71/96]
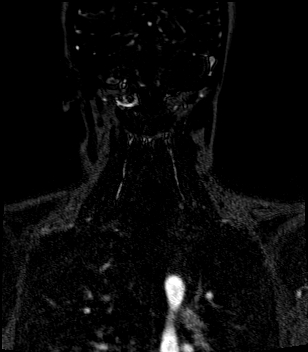
[im 76/96]
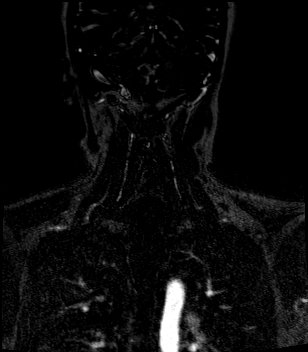
[im 81/96]
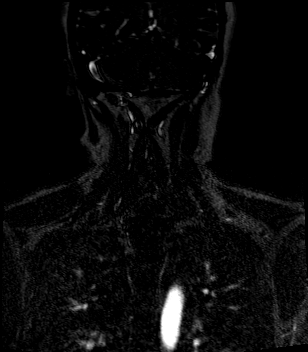
[im 86/96]
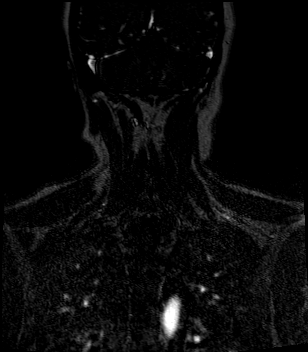
[im 91/96]
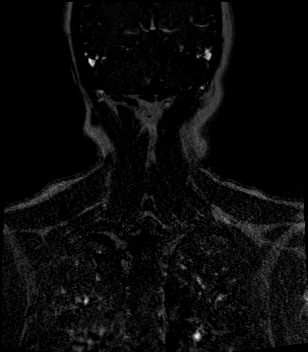
[im 96/96]
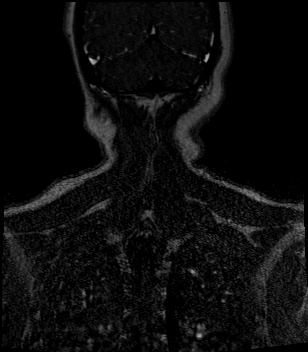

[20 of 48 positions shown; findings below may reference images not displayed]

FINDINGS: MRI HEAD FINDINGS

Brain: No acute infarct, mass effect or extra-axial collection. No
acute or chronic hemorrhage. Normal white matter signal, parenchymal
volume and CSF spaces. The midline structures are normal.

Vascular: Major flow voids are preserved.

Skull and upper cervical spine: Normal calvarium and skull base.
Visualized upper cervical spine and soft tissues are normal.

Sinuses/Orbits:No paranasal sinus fluid levels or advanced mucosal
thickening. No mastoid or middle ear effusion. Normal orbits.

MRA HEAD FINDINGS

POSTERIOR CIRCULATION:

--Vertebral arteries: Normal

--Inferior cerebellar arteries: Normal.

--Basilar artery: Normal.

--Superior cerebellar arteries: Normal.

--Posterior cerebral arteries: Normal.

ANTERIOR CIRCULATION:

--Intracranial internal carotid arteries: Normal.

--Anterior cerebral arteries (ACA): Normal.

--Middle cerebral arteries (MCA): Normal.

ANATOMIC VARIANTS: None

MRA NECK FINDINGS

Aortic arch: Normal branching pattern.

Right carotid system: Normal.  No stenosis.

Left carotid system: Normal.  No stenosis.

Vertebral arteries: Codominant system.  Normal.

Other: None
IMPRESSION: 1. Normal MRI of the brain.
2. Normal MRA of the head and neck.

## 2022-02-17 IMAGING — MR MR HEAD W/O CM
11 series · 43 of 48 positions shown · IV contrast (gadavist)
Comparison: None Available.

CLINICAL DATA: Facial weakness and left upper extremity weakness



[Series 5: ax dwi_tracew · axial · 3.0mm · 0.65mm/px · z∈[-84,+70]mm · 5 of 48 slices shown]
[im 1/48]
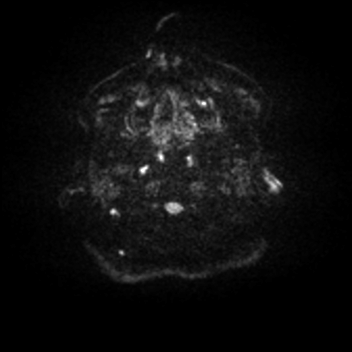
[im 12/48]
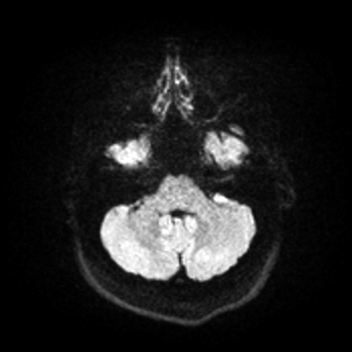
[im 24/48]
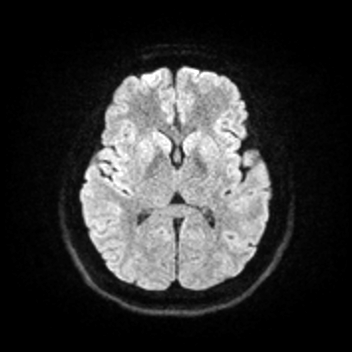
[im 36/48]
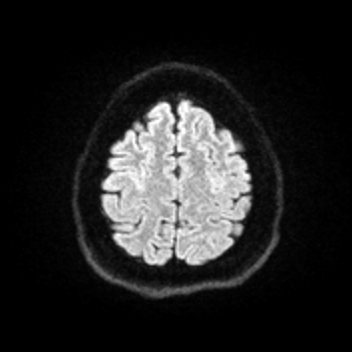
[im 48/48]
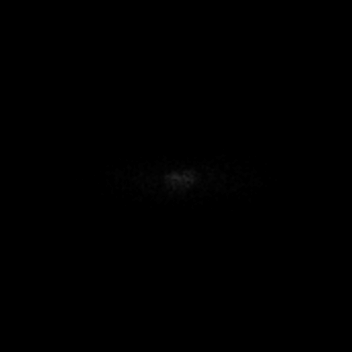

[Series 6: ax dwi_adc · axial · 3.0mm · 0.65mm/px · z∈[-84,+70]mm · 4 of 48 slices shown]
[im 1/48]
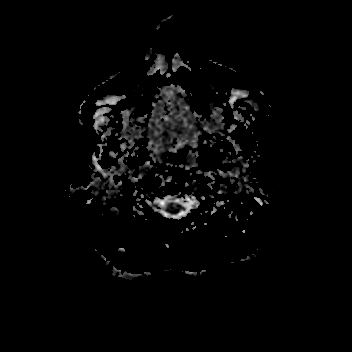
[im 16/48]
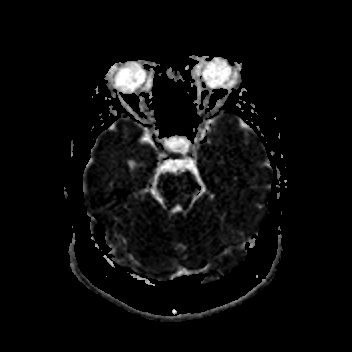
[im 32/48]
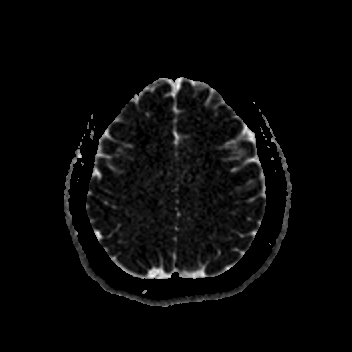
[im 48/48]
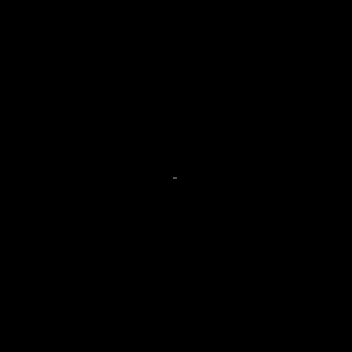

[Series 7: cor dwi_tracew · coronal · 5.0mm · 0.65mm/px · 3 of 34 slices shown]
[im 1/34]
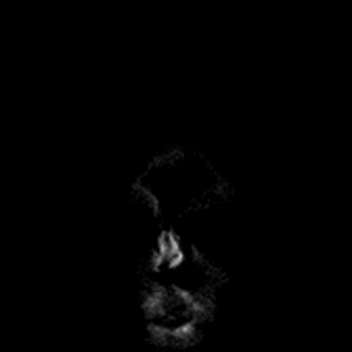
[im 17/34]
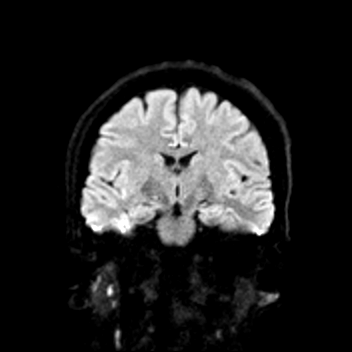
[im 34/34]
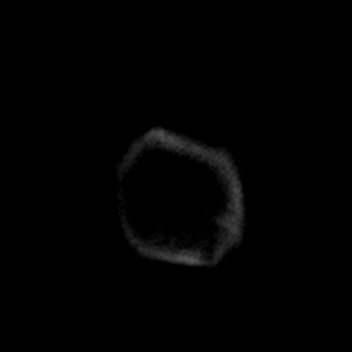

[Series 8: cor dwi_adc · coronal · 5.0mm · 0.65mm/px · 3 of 34 slices shown]
[im 1/34]
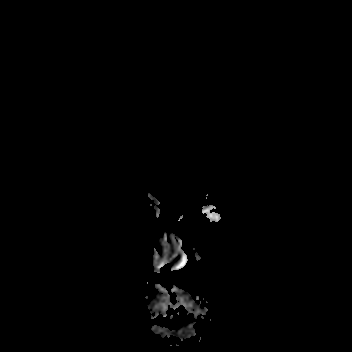
[im 17/34]
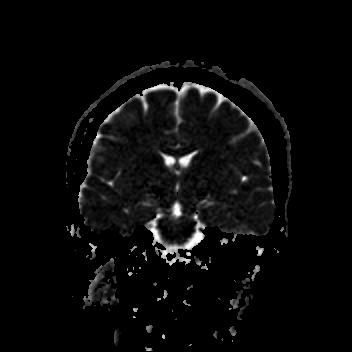
[im 34/34]
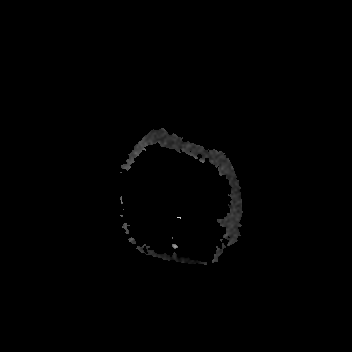

[Series 9: T1 · sagittal · 5.0mm · 0.62mm/px · 2 of 21 slices shown (1 of 2)]
[im 1/21]
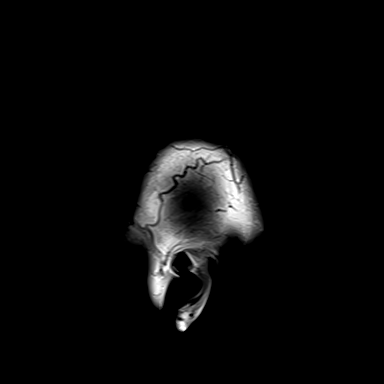
[im 21/21]
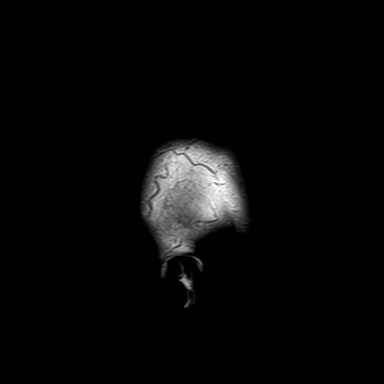

[Series 10: T2 · axial · 5.0mm · 0.53mm/px · z∈[-79,+65]mm · 2 of 25 slices shown (1 of 2)]
[im 1/25]
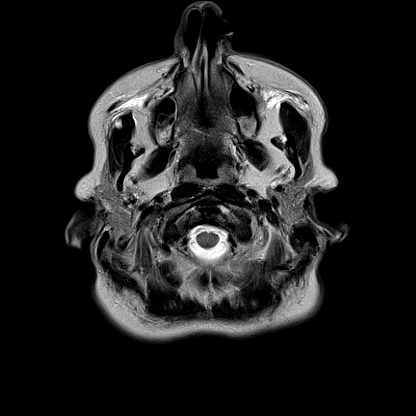
[im 25/25]
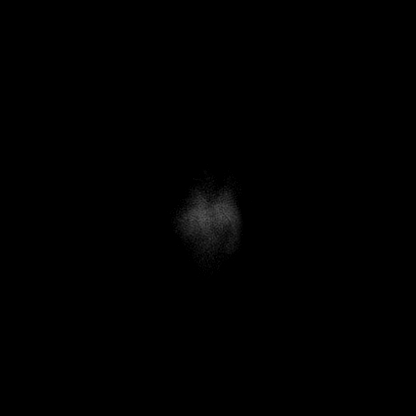

[Series 12: pha_images · axial · 3.0mm · 0.90mm/px · z∈[-95,+82]mm · 5 of 59 slices shown]
[im 1/59]
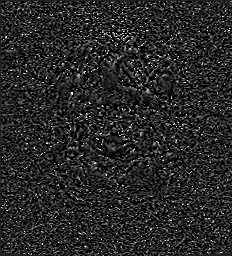
[im 15/59]
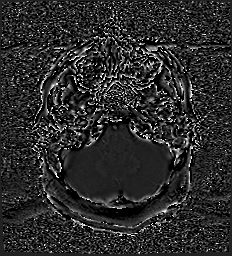
[im 30/59]
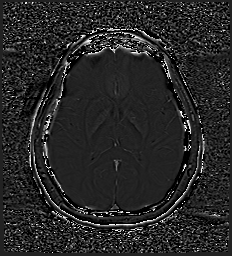
[im 44/59]
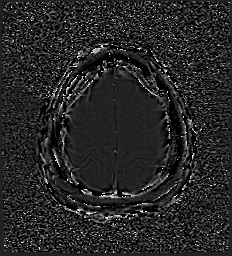
[im 59/59]
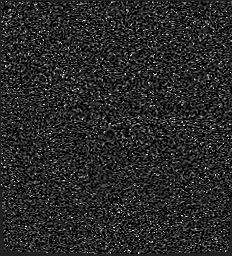

[Series 13: swi_images · axial · 3.0mm · 0.90mm/px · z∈[-95,+37]mm · 4 of 60 slices shown]
[im 1/60]
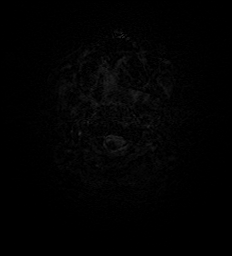
[im 15/60]
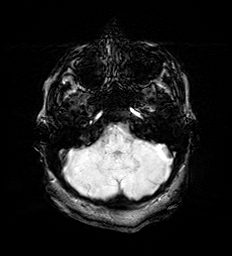
[im 30/60]
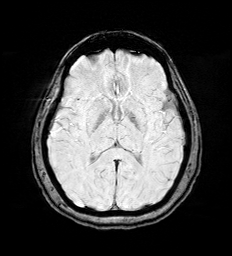
[im 45/60]
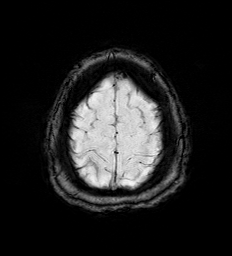

[Series 15: FLAIR · axial · 3.0mm · 0.53mm/px · z∈[-88,+74]mm · 5 of 55 slices shown]
[im 1/55]
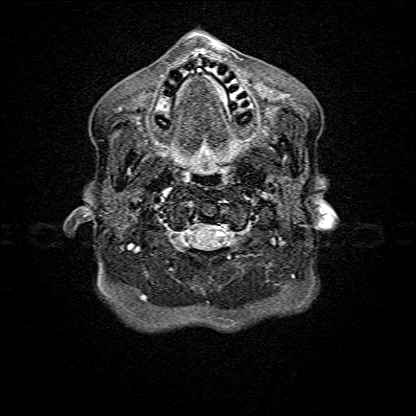
[im 14/55]
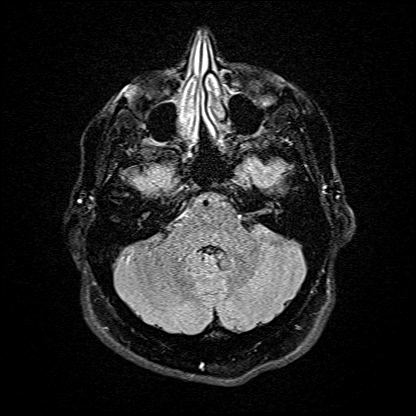
[im 28/55]
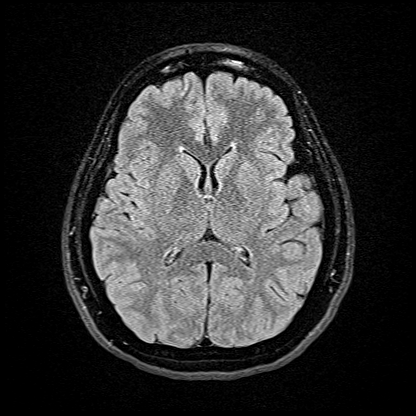
[im 41/55]
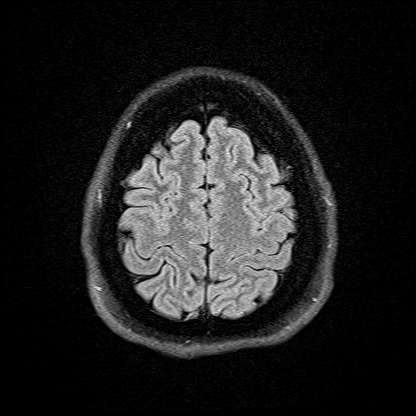
[im 55/55]
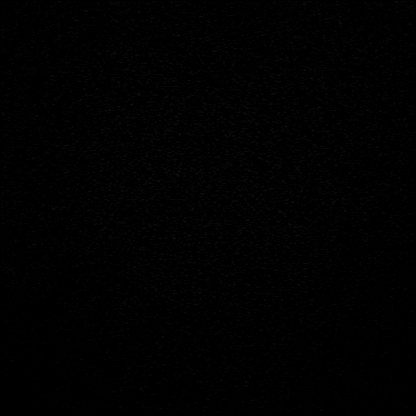

[Series 16: T1 · axial · 1.0mm · 0.98mm/px · z∈[-78,+65]mm · 8 of 144 slices shown (2 of 2)]
[im 1/144]
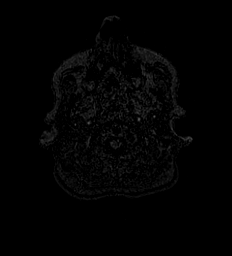
[im 27/144]
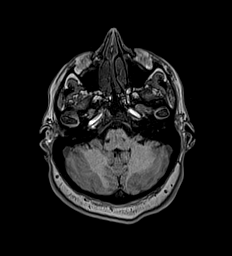
[im 40/144]
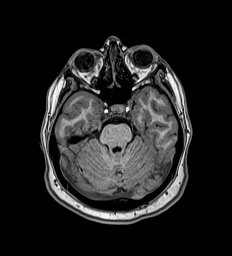
[im 66/144]
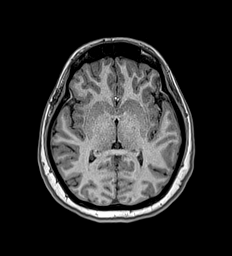
[im 79/144]
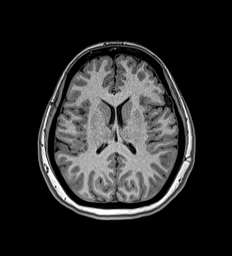
[im 105/144]
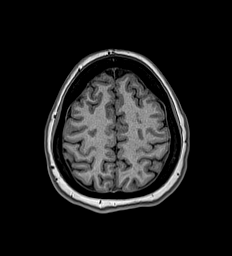
[im 118/144]
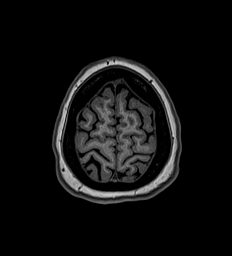
[im 144/144]
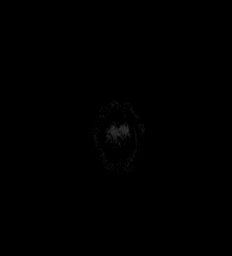

[Series 17: T2 · coronal · 5.0mm · 0.57mm/px · 2 of 29 slices shown (2 of 2)]
[im 1/29]
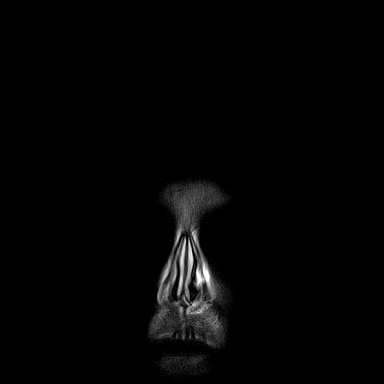
[im 29/29]
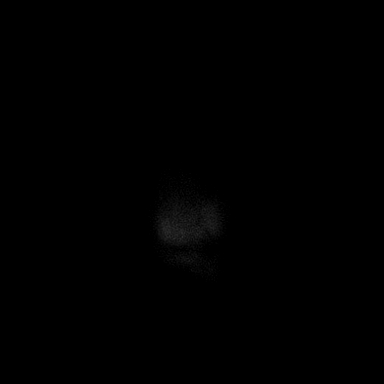

[43 of 48 positions shown; findings below may reference images not displayed]

FINDINGS: MRI HEAD FINDINGS

Brain: No acute infarct, mass effect or extra-axial collection. No
acute or chronic hemorrhage. Normal white matter signal, parenchymal
volume and CSF spaces. The midline structures are normal.

Vascular: Major flow voids are preserved.

Skull and upper cervical spine: Normal calvarium and skull base.
Visualized upper cervical spine and soft tissues are normal.

Sinuses/Orbits:No paranasal sinus fluid levels or advanced mucosal
thickening. No mastoid or middle ear effusion. Normal orbits.

MRA HEAD FINDINGS

POSTERIOR CIRCULATION:

--Vertebral arteries: Normal

--Inferior cerebellar arteries: Normal.

--Basilar artery: Normal.

--Superior cerebellar arteries: Normal.

--Posterior cerebral arteries: Normal.

ANTERIOR CIRCULATION:

--Intracranial internal carotid arteries: Normal.

--Anterior cerebral arteries (ACA): Normal.

--Middle cerebral arteries (MCA): Normal.

ANATOMIC VARIANTS: None

MRA NECK FINDINGS

Aortic arch: Normal branching pattern.

Right carotid system: Normal.  No stenosis.

Left carotid system: Normal.  No stenosis.

Vertebral arteries: Codominant system.  Normal.

Other: None
IMPRESSION: 1. Normal MRI of the brain.
2. Normal MRA of the head and neck.

## 2022-02-17 IMAGING — MR MR MRA HEAD W/O CM
1 series · 26 of 48 positions shown · IV contrast (gadavist)
Comparison: None Available.

CLINICAL DATA: Facial weakness and left upper extremity weakness



[Series 5: TOF · axial · 0.5mm · 0.48mm/px · z∈[-93,+3]mm · 26 of 217 slices shown]
[im 1/217]
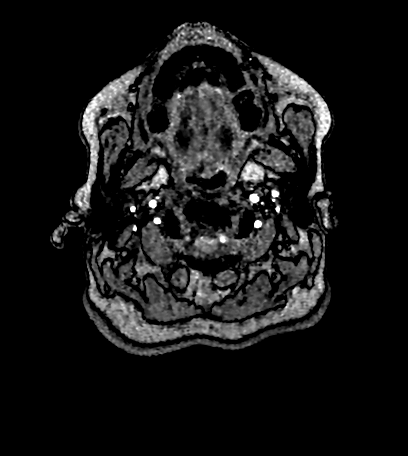
[im 5/217]
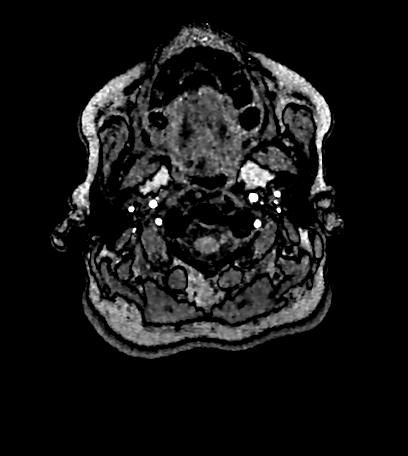
[im 10/217]
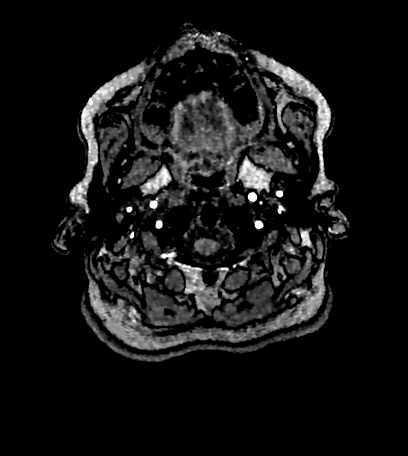
[im 14/217]
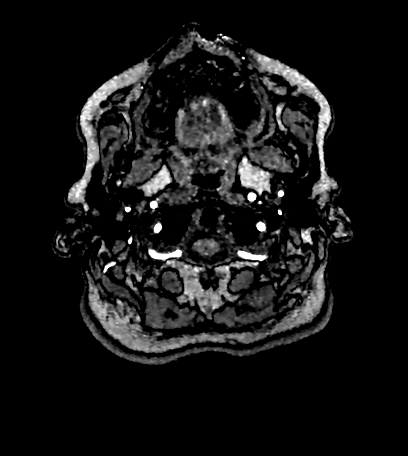
[im 19/217]
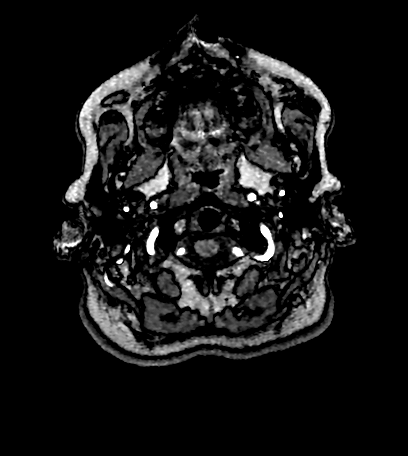
[im 23/217]
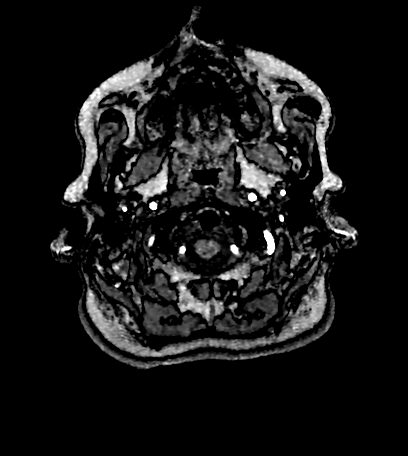
[im 28/217]
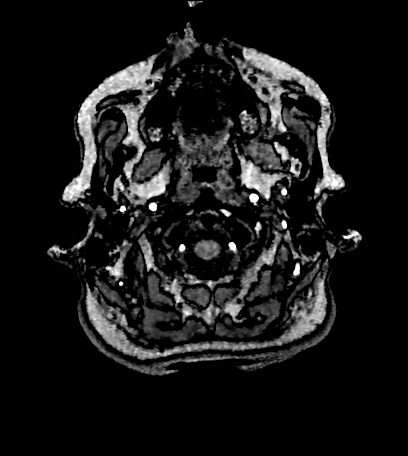
[im 33/217]
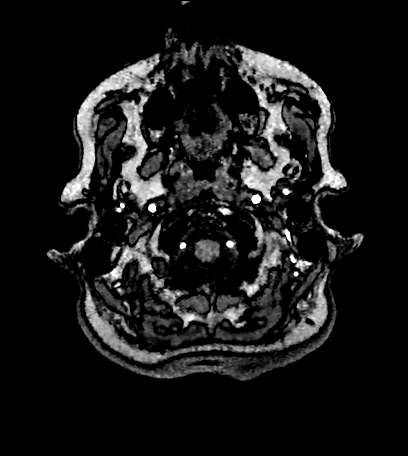
[im 37/217]
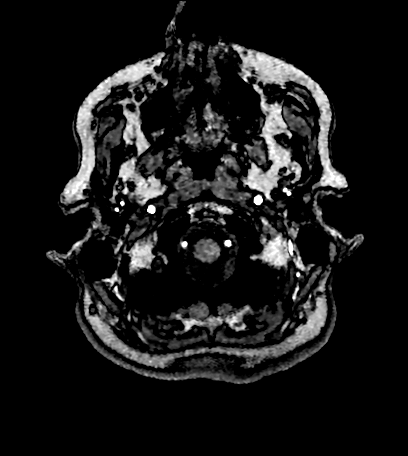
[im 42/217]
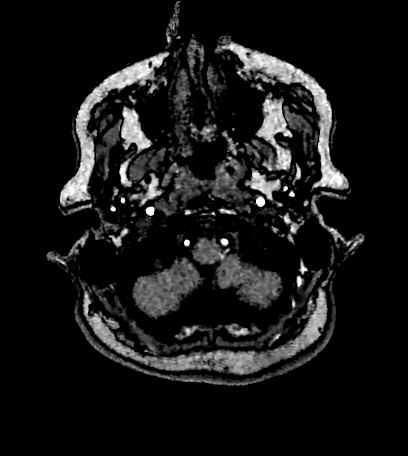
[im 46/217]
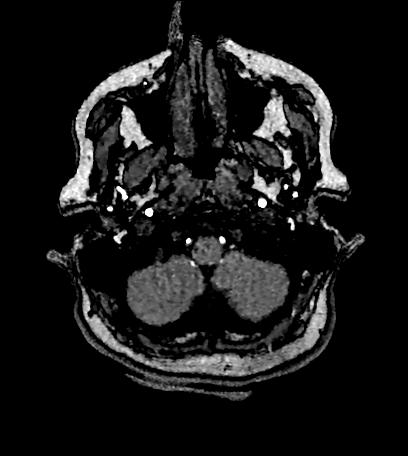
[im 51/217]
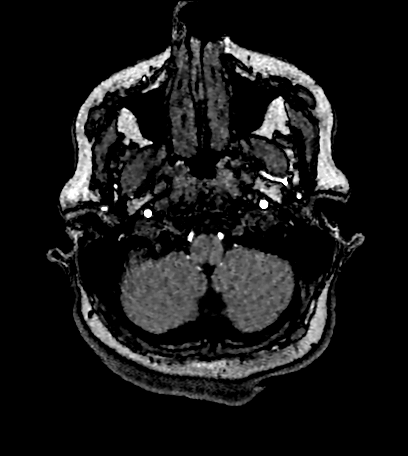
[im 56/217]
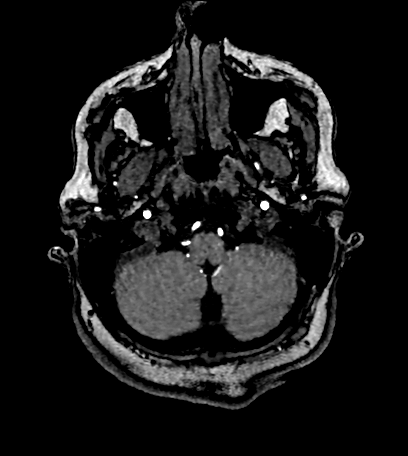
[im 60/217]
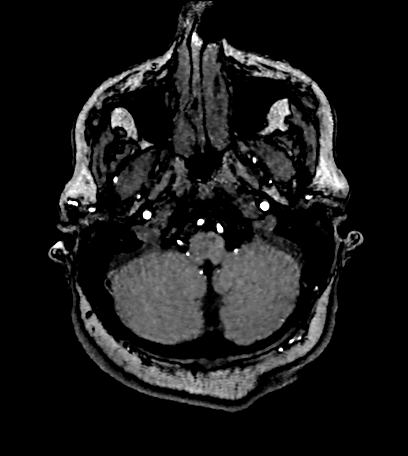
[im 65/217]
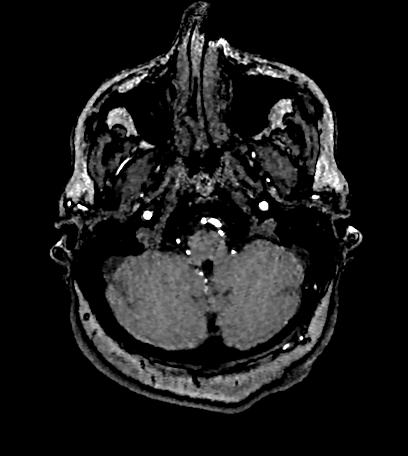
[im 69/217]
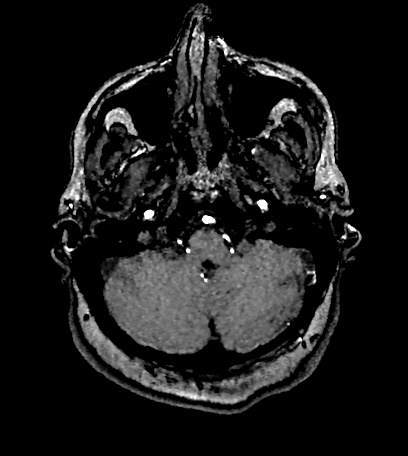
[im 74/217]
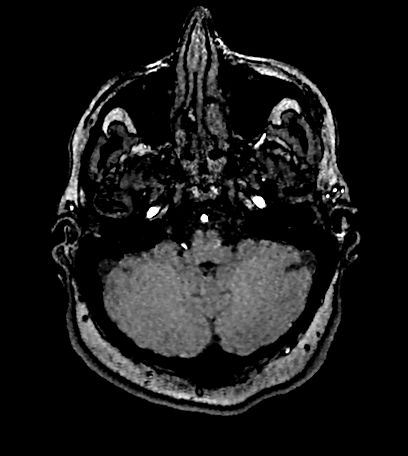
[im 79/217]
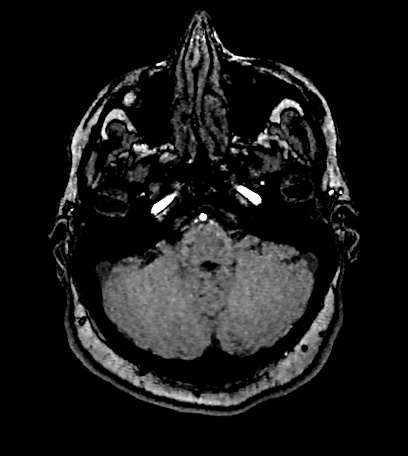
[im 83/217]
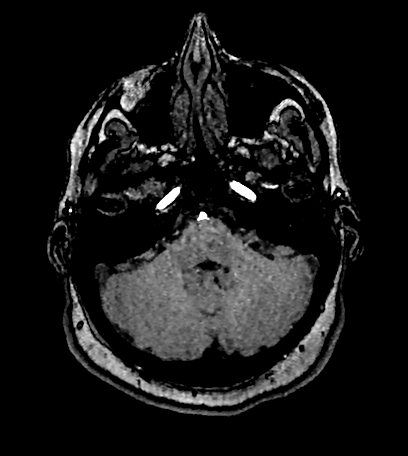
[im 97/217]
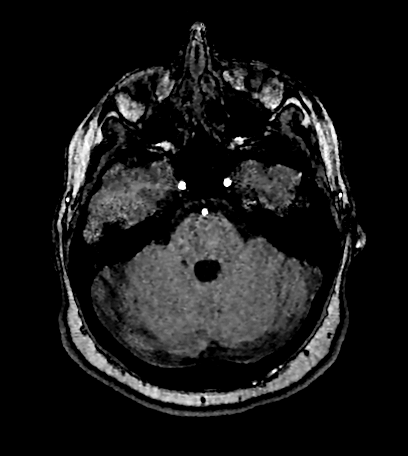
[im 111/217]
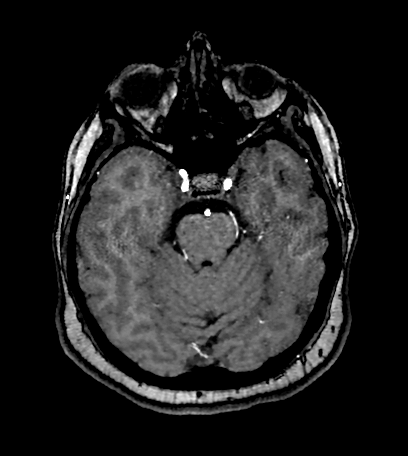
[im 125/217]
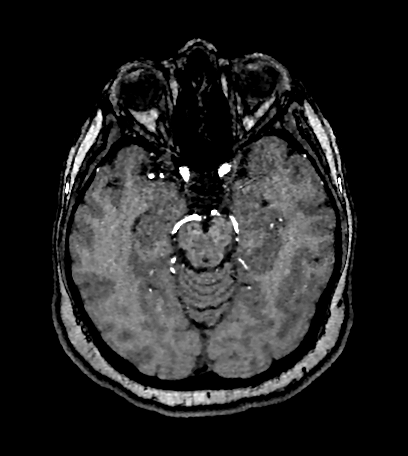
[im 152/217]
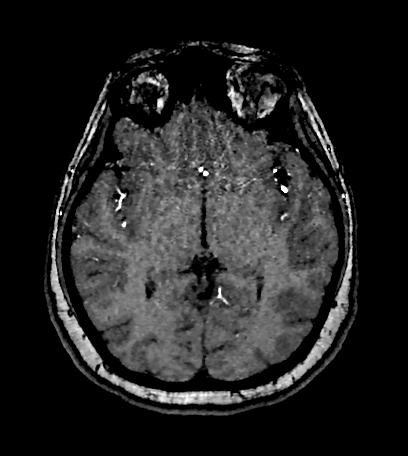
[im 180/217]
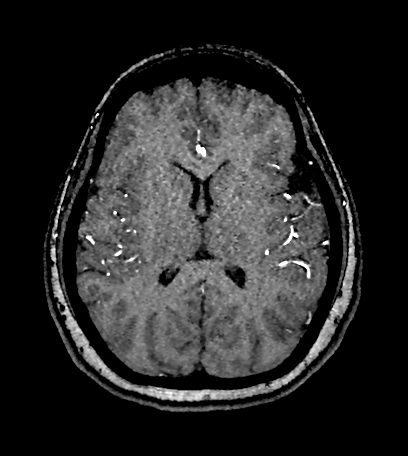
[im 184/217]
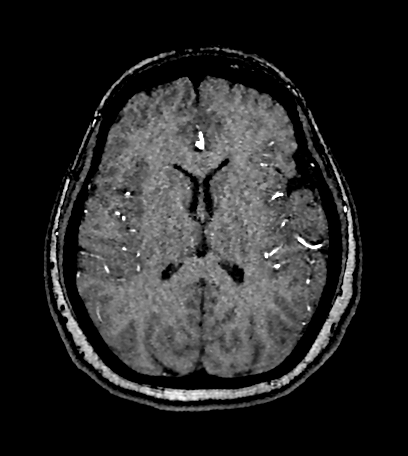
[im 207/217]
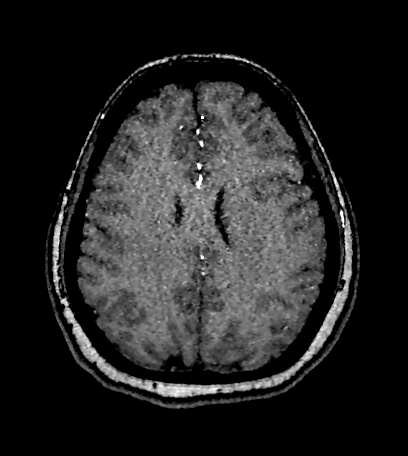

[26 of 48 positions shown; findings below may reference images not displayed]

FINDINGS: MRI HEAD FINDINGS

Brain: No acute infarct, mass effect or extra-axial collection. No
acute or chronic hemorrhage. Normal white matter signal, parenchymal
volume and CSF spaces. The midline structures are normal.

Vascular: Major flow voids are preserved.

Skull and upper cervical spine: Normal calvarium and skull base.
Visualized upper cervical spine and soft tissues are normal.

Sinuses/Orbits:No paranasal sinus fluid levels or advanced mucosal
thickening. No mastoid or middle ear effusion. Normal orbits.

MRA HEAD FINDINGS

POSTERIOR CIRCULATION:

--Vertebral arteries: Normal

--Inferior cerebellar arteries: Normal.

--Basilar artery: Normal.

--Superior cerebellar arteries: Normal.

--Posterior cerebral arteries: Normal.

ANTERIOR CIRCULATION:

--Intracranial internal carotid arteries: Normal.

--Anterior cerebral arteries (ACA): Normal.

--Middle cerebral arteries (MCA): Normal.

ANATOMIC VARIANTS: None

MRA NECK FINDINGS

Aortic arch: Normal branching pattern.

Right carotid system: Normal.  No stenosis.

Left carotid system: Normal.  No stenosis.

Vertebral arteries: Codominant system.  Normal.

Other: None
IMPRESSION: 1. Normal MRI of the brain.
2. Normal MRA of the head and neck.

## 2022-02-17 MED ORDER — GADOBUTROL 1 MMOL/ML IV SOLN
10.0000 mL | Freq: Once | INTRAVENOUS | Status: AC | PRN
  Administered 2022-02-17: 10 mL via INTRAVENOUS

## 2022-02-17 NOTE — ED Notes (Signed)
Pt returned from MRI °

## 2022-02-17 NOTE — Discharge Instructions (Addendum)
Please follow-up with neurology.  I did also send diabetes test for emergency room today that will not result tonight but you can follow-up with your neurologist or PCP.  Please return immediately for any new or worsening of your symptoms.

## 2022-02-17 NOTE — ED Notes (Signed)
Pt discharge information reviewed. Pt understands need for follow up care and when to return if symptoms worsen. All questions answered. Pt is alert and oriented with even and regular respirations. Pt is seen ambulating out of department with string steady gait.   

## 2022-02-17 NOTE — ED Notes (Signed)
Patient transported to MRI 

## 2022-10-11 ENCOUNTER — Ambulatory Visit: Payer: Managed Care, Other (non HMO) | Admitting: Dermatology

## 2022-10-23 ENCOUNTER — Other Ambulatory Visit: Payer: Self-pay | Admitting: Dermatology

## 2022-10-23 DIAGNOSIS — B001 Herpesviral vesicular dermatitis: Secondary | ICD-10-CM

## 2022-10-23 NOTE — Telephone Encounter (Signed)
Patient needs office visit for future refills.

## 2022-11-06 ENCOUNTER — Other Ambulatory Visit: Payer: Self-pay | Admitting: Dermatology

## 2022-11-06 DIAGNOSIS — B001 Herpesviral vesicular dermatitis: Secondary | ICD-10-CM

## 2023-01-03 ENCOUNTER — Other Ambulatory Visit: Payer: Self-pay | Admitting: Dermatology

## 2023-01-03 DIAGNOSIS — B001 Herpesviral vesicular dermatitis: Secondary | ICD-10-CM

## 2023-01-17 ENCOUNTER — Other Ambulatory Visit: Payer: Self-pay

## 2023-01-17 DIAGNOSIS — B001 Herpesviral vesicular dermatitis: Secondary | ICD-10-CM

## 2023-01-17 MED ORDER — VALACYCLOVIR HCL 500 MG PO TABS: 500.0000 mg | ORAL_TABLET | Freq: Every day | ORAL | 0 refills | Status: DC

## 2023-01-17 NOTE — Progress Notes (Signed)
Patient called regarding current cold sore outbreak. Patient needs yearly appt.   1 RF sent and patient advised to keep follow up appt. aw

## 2023-01-29 ENCOUNTER — Ambulatory Visit: Payer: Managed Care, Other (non HMO) | Admitting: Dermatology

## 2023-01-29 VITALS — BP 120/61 | HR 97

## 2023-01-29 DIAGNOSIS — B001 Herpesviral vesicular dermatitis: Secondary | ICD-10-CM

## 2023-01-29 DIAGNOSIS — B009 Herpesviral infection, unspecified: Secondary | ICD-10-CM

## 2023-01-29 MED ORDER — VALACYCLOVIR HCL 500 MG PO TABS: 500.0000 mg | ORAL_TABLET | Freq: Two times a day (BID) | ORAL | 4 refills | Status: AC

## 2023-01-29 NOTE — Patient Instructions (Addendum)
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  It is infectious and can be spread from person to person by direct contact.  It is not curable, but is treatable with topical and oral medication.   Upon symptoms burning or tingling Take 4 tabs (2000 mg total) and repeat in 12 hours for flares.   Continue Valacyclovir 500 mg qd for prevention. Take with large glass of water.     Due to recent changes in healthcare laws, you may see results of your pathology and/or laboratory studies on MyChart before the doctors have had a chance to review them. We understand that in some cases there may be results that are confusing or concerning to you. Please understand that not all results are received at the same time and often the doctors may need to interpret multiple results in order to provide you with the best plan of care or course of treatment. Therefore, we ask that you please give [place] 2 business days to thoroughly review all your results before contacting the office for clarification. Should we see a critical lab result, you will be contacted sooner.   If You Need Anything After Your Visit  If you have any questions or concerns for your doctor, please call our main line at [phone] and press option 4 to reach your doctor's medical assistant. If no one answers, please leave a voicemail as directed and we will return your call as soon as possible. Messages left after 4 pm will be answered the following business day.   You may also send [place] a message via MyChart. We typically respond to MyChart messages within 1-2 business days.  For prescription refills, please ask your pharmacy to contact our office. Our fax number is [phone].  If you have an urgent issue when the clinic is closed that cannot wait until the next business day,  you can page your doctor at the number below.    Please note that while we do our best to be available for urgent issues outside of office hours, we are not available 24/7.   If you have an urgent issue and are unable to reach [place], you may choose to seek medical care at your doctor's office, retail clinic, urgent care center, or emergency room.  If you have a medical emergency, please immediately call 911 or go to the emergency department.  Pager Numbers  - Dr. Gwen Pounds: [phone]  - Dr. Neale Burly: [phone]  - Dr. Roseanne Reno: [phone]  In the event of inclement weather, please call our main line at [phone] for an update on the status of any delays or closures.  Dermatology Medication Tips: Please keep the boxes that topical medications come in in order to help keep track of the instructions about where and how to use these. Pharmacies typically print the medication instructions only on the boxes and not directly on the medication tubes.   If your medication is too expensive, please contact our office at [phone] option 4 or send [place] a message through MyChart.   We are unable to tell what your co-pay for medications will be in advance as this is different depending on your insurance coverage. However, we may be able to find a substitute medication at lower cost or fill out paperwork to get insurance to cover a needed medication.   If a prior authorization is  required to get your medication covered by your insurance company, please allow [place] 1-2 business days to complete this process.  Drug prices often vary depending on where the prescription is filled and some pharmacies may offer cheaper prices.  The website www.goodrx.com contains coupons for medications through different pharmacies. The prices here do not account for what the cost may be with help from insurance (it may be cheaper with your insurance), but the website can give you the price if you did not use any insurance.   - You can print the associated coupon and take it with your prescription to the pharmacy.  - You may also stop by our office during regular business hours and pick up a GoodRx coupon card.  - If you need your prescription sent electronically to a different pharmacy, notify our office through [facility] or by phone at [phone] option 4.     Si Usted Necesita Algo Despus de Su Visita  Tambin puede enviarnos un mensaje a travs de Clinical cytogeneticist. Por lo general respondemos a los mensajes de MyChart en el transcurso de 1 a 2 das hbiles.  Para renovar recetas, por favor pida a su farmacia que se ponga en contacto con nuestra oficina. Annie Sable de fax es [place] [phone].  Si tiene un asunto urgente cuando la clnica est cerrada y que no puede esperar hasta el siguiente da hbil, puede llamar/localizar a su doctor(a) al nmero que aparece a continuacin.   Por favor, tenga en cuenta que aunque hacemos todo lo posible para estar disponibles para asuntos urgentes fuera del horario de [place], no estamos disponibles las 24 horas del da, los 7 [address]  [address] de la [place].   Si tiene un problema urgente y no puede comunicarse con nosotros, puede optar por buscar atencin mdica  en el consultorio de su doctor(a), en una clnica privada, en un centro de atencin urgente o en una sala de emergencias.  Si tiene Engineer, drilling, por favor llame inmediatamente al 911 o vaya a la sala de emergencias.  Nmeros de bper  - Dr. Gwen Pounds: [phone]  - Dra. Moye: [phone]  - Dra. Roseanne Reno: [phone]  En caso de inclemencias del [place], por favor llame a Lacy Duverney principal al [phone] para una actualizacin sobre el [place] de cualquier retraso o cierre.  Consejos para la medicacin en dermatologa: Por favor, guarde las cajas en las que vienen los medicamentos de uso tpico para ayudarle a seguir las instrucciones sobre dnde y cmo usarlos. Las farmacias generalmente  imprimen las instrucciones del medicamento slo en las cajas y no directamente en los tubos del [place].   Si su medicamento es muy caro, por favor, pngase en contacto con Rolm Gala llamando al [phone] y presione la opcin 4 o envenos un mensaje a travs de Clinical cytogeneticist.   No podemos decirle cul ser su copago por los medicamentos por adelantado ya que esto es diferente dependiendo de la cobertura de su seguro. Sin embargo, es posible que podamos encontrar un medicamento sustituto a Audiological scientist un formulario para que el seguro cubra el medicamento que se considera necesario.   Si se requiere una autorizacin previa para que su compaa de seguros [place] su medicamento, por favor permtanos de 1 a 2 das hbiles para completar [address]  Los precios de los medicamentos varan con frecuencia dependiendo del Environmental consultant de dnde se surte la receta y alguna farmacias pueden ofrecer precios ms baratos.  El sitio web www.goodrx.com tiene cupones para medicamentos de  diferentes farmacias. Los precios aqu no tienen en cuenta lo que podra costar con la ayuda del seguro (puede ser ms barato con su seguro), pero el sitio web puede darle el precio si no utiliz Tourist information centre manager.  - Puede imprimir el cupn correspondiente y llevarlo con su receta a la farmacia.  - Tambin puede pasar por nuestra oficina durante el horario de atencin regular y Education officer, museum una tarjeta de cupones de GoodRx.  - Si necesita que su receta se enve electrnicamente a una farmacia diferente, informe a nuestra oficina a travs de MyChart de [place] o por telfono llamando al [phone] y presione la opcin 4.

## 2023-01-29 NOTE — Progress Notes (Signed)
   Follow-Up Visit   Subjective  Kristin Freeman is a 37 y.o. 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 Here to discuss refills Seems to be getting more flares even on preventative dose, but she has been getting fraxel laser treatments for acne scarring with Dr. Sedalia Muta.   The following portions of the chart were reviewed this encounter and updated as appropriate: medications, allergies, medical history  Review of Systems:  No other skin or systemic complaints except as noted in HPI or Assessment and Plan.  Objective  Well appearing patient in no apparent distress; mood and affect are within normal limits.   A focused examination was performed of the following areas: face  Relevant exam findings are noted in the Assessment and Plan.    Assessment & Plan   HERPESVIRAL INFECTION (COLD SORES) Exam: crusted healing sore on right lower lip near oral commissure   Chronic and persistent condition with duration or expected duration over one year. Condition is symptomatic/ bothersome to patient. Not currently at goal.   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  It is infectious and can be spread from person to person by direct contact.  It is not curable, but is treatable with topical and oral medication.  Treatment Plan:  Recommend taking medication before laser Fraxel treatments   Continue Valacyclovir 500 mg qd/bid for prophylaxis. Take with large glass of water.    Take 4 tabs (2000 mg total) and repeat in 12 hours at first sign of symptoms for flares.   Cold sore  Related Medications valACYclovir (VALTREX) 500 MG tablet Take 1 tablet (500 mg total) by mouth 2 (two) times daily. Use as directed    Return if symptoms worsen or fail to improve.  I, Asher Muir, CMA,  am acting as scribe for Willeen Niece, MD.   Documentation: I have reviewed the above documentation for accuracy and completeness, and I agree with the above.  Willeen Niece, MD

## 2023-12-26 ENCOUNTER — Ambulatory Visit: Admitting: Dermatology

## 2024-03-20 ENCOUNTER — Ambulatory Visit: Admitting: Dermatology

## 2024-04-14 ENCOUNTER — Ambulatory Visit: Admitting: Dermatology

## 2024-05-19 ENCOUNTER — Other Ambulatory Visit: Payer: Self-pay

## 2024-05-19 ENCOUNTER — Other Ambulatory Visit: Payer: Self-pay | Admitting: Dermatology

## 2024-05-19 DIAGNOSIS — B001 Herpesviral vesicular dermatitis: Secondary | ICD-10-CM

## 2024-05-19 MED ORDER — VALACYCLOVIR HCL 1 G PO TABS: ORAL_TABLET | ORAL | 0 refills | Status: AC
